# Patient Record
Sex: Female | Born: 1935 | Race: Black or African American | Hispanic: No
Health system: Southern US, Community
[De-identification: ages and names within clinical notes are randomized; demographics above are authoritative.]

## PROBLEM LIST (undated history)

## (undated) DIAGNOSIS — D649 Anemia, unspecified: Secondary | ICD-10-CM

## (undated) DIAGNOSIS — I4891 Unspecified atrial fibrillation: Secondary | ICD-10-CM

## (undated) DIAGNOSIS — I509 Heart failure, unspecified: Secondary | ICD-10-CM

## (undated) DIAGNOSIS — C801 Malignant (primary) neoplasm, unspecified: Secondary | ICD-10-CM

## (undated) DIAGNOSIS — E119 Type 2 diabetes mellitus without complications: Secondary | ICD-10-CM

## (undated) DIAGNOSIS — I10 Essential (primary) hypertension: Secondary | ICD-10-CM

## (undated) DIAGNOSIS — N289 Disorder of kidney and ureter, unspecified: Secondary | ICD-10-CM

## (undated) HISTORY — PX: COLON SURGERY: SHX602

---

## 2020-01-13 ENCOUNTER — Ambulatory Visit: Payer: Medicare Other | Attending: Internal Medicine

## 2020-01-13 DIAGNOSIS — Z23 Encounter for immunization: Secondary | ICD-10-CM | POA: Insufficient documentation

## 2020-01-13 NOTE — Progress Notes (Signed)
   Covid-19 Vaccination Clinic  Name:  Emmaleigh Longo    MRN: [id number] DOB: 1922/05/28  01/13/2020  Ms. Macaulay was observed post Covid-19 immunization for 15 minutes without incidence. She was provided with Vaccine Information Sheet and instruction to access the V-Safe system.   Ms. Primo was instructed to call 911 with any severe reactions post vaccine: Marland Kitchen Difficulty breathing  . Swelling of your face and throat  . A fast heartbeat  . A bad rash all over your body  . Dizziness and weakness    Immunizations Administered    Name Date Dose VIS Date Route   Pfizer COVID-19 Vaccine 01/13/2020  1:48 PM 0.3 mL 11/09/2019 Intramuscular   Manufacturer: [place]   Lot: [id number]   [place]: [id number]

## 2020-01-15 ENCOUNTER — Emergency Department (HOSPITAL_COMMUNITY): Payer: Medicare Other

## 2020-01-15 ENCOUNTER — Other Ambulatory Visit: Payer: Self-pay

## 2020-01-15 ENCOUNTER — Encounter (HOSPITAL_COMMUNITY): Payer: Self-pay | Admitting: Emergency Medicine

## 2020-01-15 ENCOUNTER — Inpatient Hospital Stay (HOSPITAL_COMMUNITY)
Admission: EM | Admit: 2020-01-15 | Discharge: 2020-01-28 | DRG: 189 | Disposition: E | Payer: Medicare Other | Source: Ambulatory Visit | Attending: Internal Medicine | Admitting: Internal Medicine

## 2020-01-15 DIAGNOSIS — J9601 Acute respiratory failure with hypoxia: Secondary | ICD-10-CM | POA: Diagnosis present

## 2020-01-15 DIAGNOSIS — R509 Fever, unspecified: Secondary | ICD-10-CM | POA: Diagnosis present

## 2020-01-15 DIAGNOSIS — R451 Restlessness and agitation: Secondary | ICD-10-CM | POA: Diagnosis not present

## 2020-01-15 DIAGNOSIS — E119 Type 2 diabetes mellitus without complications: Secondary | ICD-10-CM | POA: Diagnosis present

## 2020-01-15 DIAGNOSIS — I4891 Unspecified atrial fibrillation: Secondary | ICD-10-CM | POA: Diagnosis present

## 2020-01-15 DIAGNOSIS — Z7901 Long term (current) use of anticoagulants: Secondary | ICD-10-CM

## 2020-01-15 DIAGNOSIS — E8770 Fluid overload, unspecified: Secondary | ICD-10-CM | POA: Diagnosis present

## 2020-01-15 DIAGNOSIS — N179 Acute kidney failure, unspecified: Secondary | ICD-10-CM | POA: Diagnosis present

## 2020-01-15 DIAGNOSIS — Z880 Allergy status to penicillin: Secondary | ICD-10-CM

## 2020-01-15 DIAGNOSIS — F419 Anxiety disorder, unspecified: Secondary | ICD-10-CM | POA: Diagnosis present

## 2020-01-15 DIAGNOSIS — J90 Pleural effusion, not elsewhere classified: Secondary | ICD-10-CM

## 2020-01-15 DIAGNOSIS — R0603 Acute respiratory distress: Secondary | ICD-10-CM

## 2020-01-15 DIAGNOSIS — C3490 Malignant neoplasm of unspecified part of unspecified bronchus or lung: Secondary | ICD-10-CM | POA: Diagnosis present

## 2020-01-15 DIAGNOSIS — R110 Nausea: Secondary | ICD-10-CM | POA: Diagnosis present

## 2020-01-15 DIAGNOSIS — Z85038 Personal history of other malignant neoplasm of large intestine: Secondary | ICD-10-CM

## 2020-01-15 DIAGNOSIS — K117 Disturbances of salivary secretion: Secondary | ICD-10-CM | POA: Diagnosis present

## 2020-01-15 DIAGNOSIS — Z6839 Body mass index (BMI) 39.0-39.9, adult: Secondary | ICD-10-CM

## 2020-01-15 DIAGNOSIS — I10 Essential (primary) hypertension: Secondary | ICD-10-CM | POA: Diagnosis present

## 2020-01-15 DIAGNOSIS — Z7189 Other specified counseling: Secondary | ICD-10-CM | POA: Diagnosis not present

## 2020-01-15 DIAGNOSIS — C7801 Secondary malignant neoplasm of right lung: Secondary | ICD-10-CM | POA: Diagnosis present

## 2020-01-15 DIAGNOSIS — J9691 Respiratory failure, unspecified with hypoxia: Secondary | ICD-10-CM | POA: Diagnosis present

## 2020-01-15 DIAGNOSIS — Z79899 Other long term (current) drug therapy: Secondary | ICD-10-CM

## 2020-01-15 DIAGNOSIS — J690 Pneumonitis due to inhalation of food and vomit: Secondary | ICD-10-CM | POA: Diagnosis not present

## 2020-01-15 DIAGNOSIS — Z515 Encounter for palliative care: Secondary | ICD-10-CM | POA: Diagnosis not present

## 2020-01-15 DIAGNOSIS — Z8673 Personal history of transient ischemic attack (TIA), and cerebral infarction without residual deficits: Secondary | ICD-10-CM | POA: Diagnosis not present

## 2020-01-15 DIAGNOSIS — R918 Other nonspecific abnormal finding of lung field: Secondary | ICD-10-CM

## 2020-01-15 DIAGNOSIS — K5900 Constipation, unspecified: Secondary | ICD-10-CM | POA: Diagnosis present

## 2020-01-15 DIAGNOSIS — J910 Malignant pleural effusion: Secondary | ICD-10-CM | POA: Diagnosis present

## 2020-01-15 DIAGNOSIS — H04129 Dry eye syndrome of unspecified lacrimal gland: Secondary | ICD-10-CM | POA: Diagnosis present

## 2020-01-15 DIAGNOSIS — Z87442 Personal history of urinary calculi: Secondary | ICD-10-CM

## 2020-01-15 DIAGNOSIS — R339 Retention of urine, unspecified: Secondary | ICD-10-CM | POA: Diagnosis present

## 2020-01-15 DIAGNOSIS — Z882 Allergy status to sulfonamides status: Secondary | ICD-10-CM | POA: Diagnosis not present

## 2020-01-15 DIAGNOSIS — Z7722 Contact with and (suspected) exposure to environmental tobacco smoke (acute) (chronic): Secondary | ICD-10-CM | POA: Diagnosis present

## 2020-01-15 DIAGNOSIS — Z20822 Contact with and (suspected) exposure to covid-19: Secondary | ICD-10-CM | POA: Diagnosis present

## 2020-01-15 DIAGNOSIS — Z8249 Family history of ischemic heart disease and other diseases of the circulatory system: Secondary | ICD-10-CM | POA: Diagnosis not present

## 2020-01-15 DIAGNOSIS — J9692 Respiratory failure, unspecified with hypercapnia: Secondary | ICD-10-CM | POA: Diagnosis present

## 2020-01-15 DIAGNOSIS — J9602 Acute respiratory failure with hypercapnia: Secondary | ICD-10-CM | POA: Diagnosis present

## 2020-01-15 DIAGNOSIS — Z66 Do not resuscitate: Secondary | ICD-10-CM | POA: Diagnosis present

## 2020-01-15 HISTORY — DX: Type 2 diabetes mellitus without complications: E11.9

## 2020-01-15 HISTORY — DX: Essential (primary) hypertension: I10

## 2020-01-15 HISTORY — DX: Malignant (primary) neoplasm, unspecified: C80.1

## 2020-01-15 HISTORY — DX: Disorder of kidney and ureter, unspecified: N28.9

## 2020-01-15 HISTORY — DX: Heart failure, unspecified: I50.9

## 2020-01-15 HISTORY — DX: Unspecified atrial fibrillation: I48.91

## 2020-01-15 HISTORY — DX: Anemia, unspecified: D64.9

## 2020-01-15 LAB — CBC
HCT: 43.3 % (ref 36.0–46.0)
Hemoglobin: 12.9 g/dL (ref 12.0–15.0)
MCH: 24.1 pg — ABNORMAL LOW (ref 26.0–34.0)
MCHC: 29.8 g/dL — ABNORMAL LOW (ref 30.0–36.0)
MCV: 80.8 fL (ref 80.0–100.0)
Platelets: 359 10*3/uL (ref 150–400)
RBC: 5.36 MIL/uL — ABNORMAL HIGH (ref 3.87–5.11)
RDW: 20.3 % — ABNORMAL HIGH (ref 11.5–15.5)
WBC: 10.7 10*3/uL — ABNORMAL HIGH (ref 4.0–10.5)
nRBC: 0 % (ref 0.0–0.2)

## 2020-01-15 LAB — BASIC METABOLIC PANEL
Anion gap: 12 (ref 5–15)
BUN: 19 mg/dL (ref 8–23)
CO2: 24 mmol/L (ref 22–32)
Calcium: 10.3 mg/dL (ref 8.9–10.3)
Chloride: 104 mmol/L (ref 98–111)
Creatinine, Ser: 1.18 mg/dL — ABNORMAL HIGH (ref 0.44–1.00)
GFR calc Af Amer: 45 mL/min — ABNORMAL LOW (ref >60)
GFR calc non Af Amer: 39 mL/min — ABNORMAL LOW (ref >60)
Glucose, Bld: 151 mg/dL — ABNORMAL HIGH (ref 70–99)
Potassium: 4 mmol/L (ref 3.5–5.1)
Sodium: 140 mmol/L (ref 135–145)

## 2020-01-15 LAB — RESPIRATORY PANEL BY RT PCR (FLU A&B, COVID)
Influenza A by PCR: NEGATIVE
Influenza B by PCR: NEGATIVE
SARS Coronavirus 2 by RT PCR: NEGATIVE

## 2020-01-15 LAB — URINALYSIS, ROUTINE W REFLEX MICROSCOPIC
Bilirubin Urine: NEGATIVE
Glucose, UA: NEGATIVE mg/dL
Ketones, ur: NEGATIVE mg/dL
Nitrite: NEGATIVE
Protein, ur: 30 mg/dL — AB
RBC / HPF: >50 RBC/hpf — ABNORMAL HIGH (ref 0–5)
Specific Gravity, Urine: 1.042 — ABNORMAL HIGH (ref 1.005–1.030)
WBC, UA: >50 WBC/hpf — ABNORMAL HIGH (ref 0–5)
pH: 5 (ref 5.0–8.0)

## 2020-01-15 LAB — HEPATIC FUNCTION PANEL
ALT: 15 U/L (ref 0–44)
AST: 14 U/L — ABNORMAL LOW (ref 15–41)
Albumin: 2.8 g/dL — ABNORMAL LOW (ref 3.5–5.0)
Alkaline Phosphatase: 62 U/L (ref 38–126)
Bilirubin, Direct: 0.2 mg/dL (ref 0.0–0.2)
Indirect Bilirubin: 0.7 mg/dL (ref 0.3–0.9)
Total Bilirubin: 0.9 mg/dL (ref 0.3–1.2)
Total Protein: 6.6 g/dL (ref 6.5–8.1)

## 2020-01-15 LAB — BRAIN NATRIURETIC PEPTIDE: B Natriuretic Peptide: 577.6 pg/mL — ABNORMAL HIGH (ref 0.0–100.0)

## 2020-01-15 LAB — TROPONIN I (HIGH SENSITIVITY)
Troponin I (High Sensitivity): 8 ng/L (ref <18)
Troponin I (High Sensitivity): 9 ng/L (ref <18)

## 2020-01-15 LAB — LIPASE, BLOOD: Lipase: 22 U/L (ref 11–51)

## 2020-01-15 LAB — LACTIC ACID, PLASMA: Lactic Acid, Venous: 1.8 mmol/L (ref 0.5–1.9)

## 2020-01-15 MED ORDER — SODIUM CHLORIDE 0.9 % IV BOLUS
500.0000 mL | Freq: Once | INTRAVENOUS | Status: AC
  Administered 2020-01-15: 500 mL via INTRAVENOUS

## 2020-01-15 MED ORDER — FUROSEMIDE 20 MG PO TABS: 20.0000 mg | ORAL_TABLET | Freq: Every day | ORAL | Status: DC

## 2020-01-15 MED ORDER — METOPROLOL TARTRATE 50 MG PO TABS
50.0000 mg | ORAL_TABLET | Freq: Every day | ORAL | Status: DC
  Administered 2020-01-15 – 2020-01-16 (×2): 50 mg via ORAL
  Filled 2020-01-15: qty 2
  Filled 2020-01-15: qty 1

## 2020-01-15 MED ORDER — IOHEXOL 350 MG/ML SOLN
80.0000 mL | Freq: Once | INTRAVENOUS | Status: AC | PRN
  Administered 2020-01-15: 80 mL via INTRAVENOUS

## 2020-01-15 MED ORDER — DILTIAZEM HCL ER COATED BEADS 180 MG PO CP24
180.0000 mg | ORAL_CAPSULE | Freq: Every day | ORAL | Status: DC
  Administered 2020-01-16 – 2020-01-17 (×2): 180 mg via ORAL
  Filled 2020-01-15 (×2): qty 1

## 2020-01-15 MED ORDER — SODIUM CHLORIDE 0.9% FLUSH: 3.0000 mL | Freq: Once | INTRAVENOUS | Status: DC

## 2020-01-15 MED ORDER — METOPROLOL TARTRATE 25 MG PO TABS: 50.0000 mg | ORAL_TABLET | Freq: Two times a day (BID) | ORAL | Status: DC

## 2020-01-15 MED ORDER — DOCUSATE SODIUM 100 MG PO CAPS
100.0000 mg | ORAL_CAPSULE | Freq: Every day | ORAL | Status: DC
  Administered 2020-01-15 – 2020-01-16 (×2): 100 mg via ORAL
  Filled 2020-01-15 (×2): qty 1

## 2020-01-15 MED ORDER — METOPROLOL TARTRATE 100 MG PO TABS
100.0000 mg | ORAL_TABLET | Freq: Every morning | ORAL | Status: DC
  Administered 2020-01-16 – 2020-01-17 (×2): 100 mg via ORAL
  Filled 2020-01-15: qty 4
  Filled 2020-01-15: qty 1

## 2020-01-15 NOTE — ED Notes (Signed)
Pt. Asked for something to eat

## 2020-01-15 NOTE — ED Notes (Signed)
Report given to Will, RN

## 2020-01-15 NOTE — ED Triage Notes (Signed)
Patient presents with daughter stating she was sent from [facility] PCP due to a-fib being out of control, hypoxia and abdominal pain. Daughter states abdominal pain has been going on for months and at night patients sometimes c/o pressure in the left side of chest.   Daughter adds patient has appointment with cardiologist this Friday.

## 2020-01-15 NOTE — H&P (Signed)
History and Physical    Tammy Perez IFO:[id number] DOB: 1922-04-18 DOA: 01/13/2020  PCP: Jonathon Jordan, MD  Patient coming from: home, lives with daughter  I have personally briefly reviewed patient's old medical records in [place]  Chief Complaint: abdominal pain  HPI: Tammy Perez is a 86 y.o. female with medical history significant for colon cancer, TIA, atrial fibrillation on Eliquis, hypertension, and type 2 diabetes who presented with concerns of atrial fibrillation with RVR, hypoxia and abdominal pain from PCP office.  Patient recently moved here in January from [place] to live with her daughter as she was no longer able to care for herself.  Patient reports that since March 2020 she has been having issues with occasional nausea and this became a daily occurrence by July.  Denies ever vomiting.  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  Since then, patient has been getting progressively weaker and has had decreased p.o. intake.  Reportedly she lost about 20 pounds since she got out of rehab. Daughter has also seen that she has been short of breath when she lays flat but patient denies feeling shortness of breath. She presented to her PCP for follow up today and was sent over for evaluation due to hypoxic and tachycardia.  On arrival she was in atrial fibrillation with RVR up to 130 but not sustained. She had desaturation down to 89% regarding 2L via [place].  Leukocytosis of 10.7.  Glucose of 151, creatinine of 1.18.  CTA abdomen/pelvis was obtained due to initial concerns for mesenteric ischemic. It showed significant narrowing at the origin of both the celiac axis and SMA but no concerns of mesenteric ischemia.  No PE Complete occlusion of the right upper lobe pulmonary artery due to mass effect from large right upper lobe lung mass. There  is a multiloculated right sided likely malignant pleural effusion. Indeterminate lesion involving the posterior right iliac crest and attention on follow up recommended.   She denies tobacco use but her husband did smoke but he has been deceased since 1s. Grandson now currently smokes around her. Family hx includes a brother who has cancer but unknown type.  No alcohol or illicit drug use.   She has received 500cc of fluids in the ED.   Review of Systems:  Constitutional: No Weight Change, No Fever ENT/Mouth: No sore throat, No Rhinorrhea Eyes: No Eye Pain, No Vision Changes Cardiovascular: No Chest Pain, no SOB,  No Palpitations Respiratory: No Cough, No Sputum, No Wheezing, no Dyspnea  Gastrointestinal: + Nausea, No Vomiting, No Diarrhea, + Constipation, no Pain Genitourinary:+ Urinary Incontinence Musculoskeletal: No Arthralgias, No Myalgias Skin: No Skin Lesions, No Pruritus, Neuro: no Weakness, No Numbness,  No Loss of Consciousness, No Syncope Psych: No Anxiety/Panic, No Depression, + decrease appetite Heme/Lymph: No Bruising, No Bleeding  Past Medical History:  Diagnosis Date  . Anemia   . Atrial fibrillation ([place])   . Cancer [facility])    Colon  . CHF (congestive heart failure) ([place])   . Diabetes mellitus without complication ([place])   . Hypertension   . Renal disorder    kidney stones    Past Surgical History:  Procedure Laterality Date  . COLON SURGERY        Allergies  Allergen Reactions  . Penicillins Swelling  . Sulfur Itching    Family history Older brother-cancer unknown Parents-hypertension  Prior  to Admission medications   Medication Sig Start Date End Date Taking? Authorizing Provider  acetaminophen (TYLENOL) 650 MG CR tablet Take 1,300 mg by mouth every 8 (eight) hours as needed for pain.   Yes [provider]  DILT-XR 180 MG 24 hr capsule Take 180 mg by mouth daily. 01/03/20  Yes [provider]  Docusate Calcium (STOOL  SOFTENER PO) Take 1 tablet by mouth at bedtime.   Yes [provider]  ELIQUIS 5 MG TABS tablet Take 5 mg by mouth 2 (two) times daily. 01/03/20  Yes [provider]  furosemide (LASIX) 20 MG tablet Take 20 mg by mouth daily.   Yes [provider]  metoprolol tartrate (LOPRESSOR) 50 MG tablet Take 50-100 mg by mouth 2 (two) times daily. 100mg  in the mornings and 50mg  in the evenings   Yes [provider]    Physical Exam: Vitals:   01/14/2020 1242 01/03/2020 1608 01/11/2020 1800 01/12/2020 2000  BP:  (!) 135/96 140/89 (!) 110/91  Pulse:  (!) 42 97 (!) 122  Resp:  (!) 24 (!) 27 (!) 25  Temp:      TempSrc:      SpO2:  99% 99% 97%  Weight: 91.2 kg     Height: 5' (1.524 m)       Constitutional: NAD, calm, comfortable, morbidly obese female laying flat in bed Vitals:   01/07/2020 1242 01/12/2020 1608 01/03/2020 1800 01/09/2020 2000  BP:  (!) 135/96 140/89 (!) 110/91  Pulse:  (!) 42 97 (!) 122  Resp:  (!) 24 (!) 27 (!) 25  Temp:      TempSrc:      SpO2:  99% 99% 97%  Weight: 91.2 kg     Height: 5' (1.524 m)      Eyes: PERRL, lids and conjunctivae normal ENMT: Mucous membranes are moist.  Neck: normal, supple Respiratory: diminished right upper and lower breath sounds, no wheezing, no crackles. Normal respiratory effort on 2L. No accessory muscle use.  Cardiovascular: Irregularly irregular rhythm, no murmurs / rubs / gallops. +3 bilateral pre-tibial lower extremity edema. 2+ pedal pulses.  Abdomen: no tenderness, no masses palpated.  Bowel sounds positive.  GU: diaper and purewick catheter in place Musculoskeletal: no clubbing / cyanosis. No joint deformity upper and lower extremities.no contractures. Normal muscle tone.  Skin: no rashes, lesions, ulcers. No induration Neurologic: CN 2-12 grossly intact. Sensation intact. Strength 4/5 in all 4.  Psychiatric: Normal judgment and insight. Alert and oriented x 3. Normal mood.     Labs on Admission: I have  personally reviewed following labs and imaging studies  CBC: Recent Labs  Lab 01/27/2020 1241  WBC 10.7*  HGB 12.9  HCT 43.3  MCV 80.8  PLT 824   Basic Metabolic Panel: Recent Labs  Lab 01/24/2020 1241  NA 140  K 4.0  CL 104  CO2 24  GLUCOSE 151*  BUN 19  CREATININE 1.18*  CALCIUM 10.3   GFR: Estimated Creatinine Clearance: 28.7 mL/min (A) (by C-G formula based on SCr of 1.18 mg/dL (H)). Liver Function Tests: Recent Labs  Lab 01/16/2020 1656  AST 14*  ALT 15  ALKPHOS 62  BILITOT 0.9  PROT 6.6  ALBUMIN 2.8*   Recent Labs  Lab 01/21/2020 1656  LIPASE 22   No results for input(s): AMMONIA in the last 168 hours. Coagulation Profile: No results for input(s): INR, PROTIME in the last 168 hours. Cardiac Enzymes: No results for input(s): CKTOTAL, CKMB, CKMBINDEX, TROPONINI in  the last 168 hours. BNP (last 3 results) No results for input(s): PROBNP in the last 8760 hours. HbA1C: No results for input(s): HGBA1C in the last 72 hours. CBG: No results for input(s): GLUCAP in the last 168 hours. Lipid Profile: No results for input(s): CHOL, HDL, LDLCALC, TRIG, CHOLHDL, LDLDIRECT in the last 72 hours. Thyroid Function Tests: No results for input(s): TSH, T4TOTAL, FREET4, T3FREE, THYROIDAB in the last 72 hours. Anemia Panel: No results for input(s): VITAMINB12, FOLATE, FERRITIN, TIBC, IRON, RETICCTPCT in the last 72 hours. Urine analysis: No results found for: COLORURINE, APPEARANCEUR, LABSPEC, PHURINE, GLUCOSEU, HGBUR, BILIRUBINUR, KETONESUR, PROTEINUR, UROBILINOGEN, NITRITE, LEUKOCYTESUR  Radiological Exams on Admission: DG Chest 2 View  Result Date: 01/21/2020 CLINICAL DATA:  Chest pain EXAM: CHEST - 2 VIEW COMPARISON:  None. FINDINGS: Partial opacification of the right hemithorax. Bilateral pleural effusions. Left basilar atelectasis/consolidation. No pneumothorax. Normal heart size. Calcified plaque along the aorta. IMPRESSION: Partial opacification right hemithorax  likely reflecting a combination of effusion, atelectasis, and possible superimposed asymmetric pulmonary edema. Left basilar atelectasis/consolidation. Electronically Signed   By: Macy Mis M.D.   On: 01/02/2020 13:31   CT Angio Chest PE W and/or Wo Contrast  Result Date: 01/23/2020 CLINICAL DATA:  Portal vein thrombosis. Concern for mesenteric ischemia. Shortness of breath. EXAM: CT ANGIOGRAPHY CHEST CT ABDOMEN AND PELVIS WITH CONTRAST TECHNIQUE: Multidetector CT imaging of the chest was performed using the standard protocol during bolus administration of intravenous contrast. Multiplanar CT image reconstructions and MIPs were obtained to evaluate the vascular anatomy. Multidetector CT imaging of the abdomen and pelvis was performed using the standard protocol during bolus administration of intravenous contrast. CONTRAST:  28mL OMNIPAQUE IOHEXOL 350 MG/ML SOLN COMPARISON:  None. FINDINGS: CTA CHEST FINDINGS Cardiovascular: Contrast injection is sufficient to demonstrate satisfactory opacification of the pulmonary arteries to the segmental level.There is no acute pulmonary embolism. There is complete occlusion of the right upper lobe pulmonary artery. This is secondary to mass effect from a large right upper lobe lung mass. There is some incomplete filling of several segmental branches of the right lower lobe and right middle lobe which are felt to be artifactual and related to contrast timing as opposed to acute pulmonary emboli. The main pulmonary artery is the pulmonary arteries are dilated. There is no CT evidence of acute right heart strain. There are atherosclerotic changes of the thoracic aorta. There is no evidence for a thoracic aortic aneurysm. Heart size is enlarged. There is no significant pericardial effusion. Coronary artery calcifications are noted. There is reflux of contrast in the IVC consistent with right-sided cardiac dysfunction. Mediastinum/Nodes: --there are few mildly prominent  mediastinal and right hilar lymph nodes. --No axillary lymphadenopathy. --No supraclavicular lymphadenopathy. --Normal thyroid gland. --The esophagus is unremarkable Lungs/Pleura: There is a large right upper lobe lung mass measuring 7.3 x 6.7 cm. This mass abuts the right hilum superiorly and occludes the right upper lobe pulmonary artery. There is a small airspace opacity in the superior left upper lobe measuring approximately 8 mm (axial series 4, image 26). There is a 1 cm pulmonary opacity in the lingula (axial series 4, image 66). There is a complex multiloculated right-sided, likely malignant pleural effusion. There is a 7 mm pulmonary nodule in the superior aspect of the right lower lobe (axial series 4, image 44). There is near complete collapse of the right upper lobe and right middle lobes. There is partial collapse of the right lower lobe. Musculoskeletal: No chest wall abnormality. No acute or significant  osseous findings. Review of the MIP images confirms the above findings. CT ABDOMEN and PELVIS FINDINGS Hepatobiliary: There is no significant abnormality involving the liver. There is a wedge-shaped hyperattenuating area in hepatic segment 5 favored to represent sequela of perfusion differences. Normal gallbladder.There is no biliary ductal dilation. There is no definite evidence for portal vein thrombosis. Appearance of the portal vein at the level of the portal venous confluence is felt to be secondary to mixing artifact. Pancreas: Normal contours without ductal dilatation. No peripancreatic fluid collection. Spleen: No splenic laceration or hematoma. Adrenals/Urinary Tract: --Adrenal glands: No adrenal hemorrhage. --Right kidney/ureter: There is a punctate nonobstructing stone in the interpolar region of the right kidney. There is no right-sided hydronephrosis. --Left kidney/ureter: The lower pole the left kidney is atrophic, likely secondary to multiple obstructing stones measuring up to  approximately 1 cm. This process is almost certainly longstanding. There is a large cyst arising from the upper pole. --Urinary bladder: Unremarkable. Stomach/Bowel: --Stomach/Duodenum: No hiatal hernia or other gastric abnormality. Normal duodenal course and caliber. --Small bowel: No dilatation or inflammation. --Colon: Pelvic floor laxity is noted. There is scattered colonic diverticula without CT evidence for diverticulitis. The patient appears to be status post partial colectomy. --Appendix: Likely surgically absent. Vascular/Lymphatic: Atherosclerotic calcification is present within the non-aneurysmal abdominal aorta, without hemodynamically significant stenosis. There is moderate stenosis at the origin of both the SMA and celiac axis. --No retroperitoneal lymphadenopathy. --No mesenteric lymphadenopathy. --No pelvic or inguinal lymphadenopathy. Reproductive: Unremarkable Other: No ascites or free air. Multiple fat containing ventral wall hernias are noted. Musculoskeletal. There is an indeterminate mix lytic and sclerotic lesion involving the right posterior iliac crest measuring approximately 2.3 by 2.2 cm (axial series 5, image 125). Review of the MIP images confirms the above findings. IMPRESSION: 1. No acute pulmonary embolism. 2. Large right upper lobe lung mass as detailed above highly concerning for bronchogenic carcinoma. This mass causes complete occlusion of portions of the right upper lobe pulmonary artery. 3. Large complex, likely malignant, right-sided pleural effusion. 4. Bilateral pulmonary opacities as detailed above. While these may represent a post infectious or inflammatory etiology, metastatic disease is not excluded. 5. Indeterminate lesion involving the posterior right iliac crest. Attention on follow-up examinations is recommended. Metastatic disease is not excluded. 6. Cardiomegaly with reflux of contrast into the IVC consistent with underlying cardiac dysfunction. The pulmonary  arteries are dilated consistent with underlying pulmonary artery hypertension. 7. Bilateral nephrolithiasis as detailed above. 8. No definite evidence for portal vein thrombosis or mesenteric ischemia. However, there is significant narrowing at the origin of both the celiac axis and SMA. The IMA remains patent. Evaluation of the portal vein was somewhat limited by contrast bolus timing. Aortic Atherosclerosis (ICD10-I70.0). Electronically Signed   By: Constance Holster M.D.   On: 01/25/2020 19:36   CT Angio Abd/Pel W and/or Wo Contrast  Result Date: 01/06/2020 CLINICAL DATA:  Portal vein thrombosis. Concern for mesenteric ischemia. Shortness of breath. EXAM: CT ANGIOGRAPHY CHEST CT ABDOMEN AND PELVIS WITH CONTRAST TECHNIQUE: Multidetector CT imaging of the chest was performed using the standard protocol during bolus administration of intravenous contrast. Multiplanar CT image reconstructions and MIPs were obtained to evaluate the vascular anatomy. Multidetector CT imaging of the abdomen and pelvis was performed using the standard protocol during bolus administration of intravenous contrast. CONTRAST:  71mL OMNIPAQUE IOHEXOL 350 MG/ML SOLN COMPARISON:  None. FINDINGS: CTA CHEST FINDINGS Cardiovascular: Contrast injection is sufficient to demonstrate satisfactory opacification of the pulmonary arteries to the segmental  level.There is no acute pulmonary embolism. There is complete occlusion of the right upper lobe pulmonary artery. This is secondary to mass effect from a large right upper lobe lung mass. There is some incomplete filling of several segmental branches of the right lower lobe and right middle lobe which are felt to be artifactual and related to contrast timing as opposed to acute pulmonary emboli. The main pulmonary artery is the pulmonary arteries are dilated. There is no CT evidence of acute right heart strain. There are atherosclerotic changes of the thoracic aorta. There is no evidence for a  thoracic aortic aneurysm. Heart size is enlarged. There is no significant pericardial effusion. Coronary artery calcifications are noted. There is reflux of contrast in the IVC consistent with right-sided cardiac dysfunction. Mediastinum/Nodes: --there are few mildly prominent mediastinal and right hilar lymph nodes. --No axillary lymphadenopathy. --No supraclavicular lymphadenopathy. --Normal thyroid gland. --The esophagus is unremarkable Lungs/Pleura: There is a large right upper lobe lung mass measuring 7.3 x 6.7 cm. This mass abuts the right hilum superiorly and occludes the right upper lobe pulmonary artery. There is a small airspace opacity in the superior left upper lobe measuring approximately 8 mm (axial series 4, image 26). There is a 1 cm pulmonary opacity in the lingula (axial series 4, image 66). There is a complex multiloculated right-sided, likely malignant pleural effusion. There is a 7 mm pulmonary nodule in the superior aspect of the right lower lobe (axial series 4, image 44). There is near complete collapse of the right upper lobe and right middle lobes. There is partial collapse of the right lower lobe. Musculoskeletal: No chest wall abnormality. No acute or significant osseous findings. Review of the MIP images confirms the above findings. CT ABDOMEN and PELVIS FINDINGS Hepatobiliary: There is no significant abnormality involving the liver. There is a wedge-shaped hyperattenuating area in hepatic segment 5 favored to represent sequela of perfusion differences. Normal gallbladder.There is no biliary ductal dilation. There is no definite evidence for portal vein thrombosis. Appearance of the portal vein at the level of the portal venous confluence is felt to be secondary to mixing artifact. Pancreas: Normal contours without ductal dilatation. No peripancreatic fluid collection. Spleen: No splenic laceration or hematoma. Adrenals/Urinary Tract: --Adrenal glands: No adrenal hemorrhage. --Right  kidney/ureter: There is a punctate nonobstructing stone in the interpolar region of the right kidney. There is no right-sided hydronephrosis. --Left kidney/ureter: The lower pole the left kidney is atrophic, likely secondary to multiple obstructing stones measuring up to approximately 1 cm. This process is almost certainly longstanding. There is a large cyst arising from the upper pole. --Urinary bladder: Unremarkable. Stomach/Bowel: --Stomach/Duodenum: No hiatal hernia or other gastric abnormality. Normal duodenal course and caliber. --Small bowel: No dilatation or inflammation. --Colon: Pelvic floor laxity is noted. There is scattered colonic diverticula without CT evidence for diverticulitis. The patient appears to be status post partial colectomy. --Appendix: Likely surgically absent. Vascular/Lymphatic: Atherosclerotic calcification is present within the non-aneurysmal abdominal aorta, without hemodynamically significant stenosis. There is moderate stenosis at the origin of both the SMA and celiac axis. --No retroperitoneal lymphadenopathy. --No mesenteric lymphadenopathy. --No pelvic or inguinal lymphadenopathy. Reproductive: Unremarkable Other: No ascites or free air. Multiple fat containing ventral wall hernias are noted. Musculoskeletal. There is an indeterminate mix lytic and sclerotic lesion involving the right posterior iliac crest measuring approximately 2.3 by 2.2 cm (axial series 5, image 125). Review of the MIP images confirms the above findings. IMPRESSION: 1. No acute pulmonary embolism. 2. Large right  upper lobe lung mass as detailed above highly concerning for bronchogenic carcinoma. This mass causes complete occlusion of portions of the right upper lobe pulmonary artery. 3. Large complex, likely malignant, right-sided pleural effusion. 4. Bilateral pulmonary opacities as detailed above. While these may represent a post infectious or inflammatory etiology, metastatic disease is not excluded. 5.  Indeterminate lesion involving the posterior right iliac crest. Attention on follow-up examinations is recommended. Metastatic disease is not excluded. 6. Cardiomegaly with reflux of contrast into the IVC consistent with underlying cardiac dysfunction. The pulmonary arteries are dilated consistent with underlying pulmonary artery hypertension. 7. Bilateral nephrolithiasis as detailed above. 8. No definite evidence for portal vein thrombosis or mesenteric ischemia. However, there is significant narrowing at the origin of both the celiac axis and SMA. The IMA remains patent. Evaluation of the portal vein was somewhat limited by contrast bolus timing. Aortic Atherosclerosis (ICD10-I70.0). Electronically Signed   By: Constance Holster M.D.   On: 01/11/2020 19:36    EKG: Independently reviewed.   Assessment/Plan  Acute hypoxic respiratory failure in the setting of large right upper lobe lung mass and multiloculated right sided likely malignant pleural effusion Admitted on 2L Arrange for thoracentesis-  pt had a dose of Eliquis this morning and will hold now. Fluid studies ordered.  Pt does not want treatment if this is a malignancy but still would like biopsy for definitive diagnosis Will need to consult hematology in the morning  Atrial fibrillation with RVR Non-sustained- will go from 110-130 Will give night time metoprolol and monitor Continue diltiazem Hold Eliquis for thoracentesis  Acute kidney injury  Creatinine of 1.18 with unknown baseline Monitor after Lasix held - edematous lower extremities on exam so will hold off on fluids Avoid nephrotoxic agents   Type 2 diabetes BG of 151 on admit. Monitor.  Hypertension Hold Lasix due to AKI  DVT prophylaxis:SCDs Code Status: DNR Family Communication: Plan discussed with patient at bedside and with daughter over the phone disposition Plan: Home with at least 2 midnight stays  Consults called:  Admission status: inpatient with at least  2 midnight stay given acute hypoxic respiratory failure secondary to new lung mass and large pleural effusion.   DVT prophylaxis:SCDs Code Status: Full Family Communication: Plan discussed with patient at bedside and with daughter over the phone  disposition Plan: Home with at least 2 midnight stays  Consults called:  Admission status: inpatient   Tammy Perez T Roper Tolson DO [facility]   If 7PM-7AM, please contact night-coverage [email or web address]   01/23/2020, 10:32 PM

## 2020-01-15 NOTE — ED Notes (Signed)
Admitting MD in room to assess pt for admission.

## 2020-01-15 NOTE — ED Provider Notes (Signed)
[facility] Provider Note   CSN: [id number] Arrival date & time: 01/13/2020  1211     History Chief Complaint  Patient presents with  . Atrial Fibrillation  . Abdominal Pain    Tammy Perez is a 86 y.o. female history of A. fib on Eliquis, previous colon cancer, CHF, diabetes here presenting with hypoxia, abdominal pain.  Patient just moved here from [place] and has no records here.  Patient has been having intermittent abdominal pain for the last 3 months.  Patient also has some palpitations.  Patient also has some left-sided chest pain as well.  Patient went to see her doctor today and was noted to be hypoxic to 88% on room air. Patient denies any fever or cough or chills. Denies any Covid exposure.  Daughter is at bedside.  The history is provided by the patient.       Past Medical History:  Diagnosis Date  . Anemia   . Atrial fibrillation ([place])   . Cancer [facility])    Colon  . CHF (congestive heart failure) ([place])   . Diabetes mellitus without complication ([place])   . Hypertension   . Renal disorder    kidney stones    There are no problems to display for this patient.   Past Surgical History:  Procedure Laterality Date  . COLON SURGERY       OB History   No obstetric history on file.     No family history on file.  Social History   Tobacco Use  . Smoking status: Not on file  Substance Use Topics  . Alcohol use: Not on file  . Drug use: Not on file    Home Medications Prior to Admission medications   Not on File    Allergies    Penicillins and Sulfur  Review of Systems   Review of Systems  Gastrointestinal: Positive for abdominal pain.  All other systems reviewed and are negative.   Physical Exam Updated Vital Signs BP 121/87   Pulse 67   Temp 98.9 F (37.2 C) (Oral)   Resp (!) 24   Ht 5' (1.524 m)   Wt 91.2 kg   SpO2 99%   BMI 39.26 kg/m   Physical Exam Vitals and nursing note reviewed.    Constitutional:      Comments: Chronically ill   HENT:     Head: Normocephalic.     Mouth/Throat:     Mouth: Mucous membranes are moist.  Eyes:     Extraocular Movements: Extraocular movements intact.  Cardiovascular:     Rate and Rhythm: Normal rate and regular rhythm.     Heart sounds: Normal heart sounds.  Pulmonary:     Effort: Pulmonary effort is normal.     Breath sounds: Normal breath sounds.  Abdominal:     General: Abdomen is flat.     Comments: Mild diffuse tenderness, no rebound   Skin:    General: Skin is warm.     Capillary Refill: Capillary refill takes less than 2 seconds.  Neurological:     General: No focal deficit present.     Mental Status: She is alert and oriented to person, place, and time.  Psychiatric:        Mood and Affect: Mood normal.        Behavior: Behavior normal.     ED Results / Procedures / Treatments   Labs (all labs ordered are listed, but only abnormal results are displayed) Labs  Reviewed  BASIC METABOLIC PANEL - Abnormal; Notable for the following components:      Result Value   Glucose, Bld 151 (*)    Creatinine, Ser 1.18 (*)    GFR calc non Af Amer 39 (*)    GFR calc Af Amer 45 (*)    All other components within normal limits  CBC - Abnormal; Notable for the following components:   WBC 10.7 (*)    RBC 5.36 (*)    MCH 24.1 (*)    MCHC 29.8 (*)    RDW 20.3 (*)    All other components within normal limits  RESPIRATORY PANEL BY RT PCR (FLU A&B, COVID)  CULTURE, BLOOD (ROUTINE X 2)  CULTURE, BLOOD (ROUTINE X 2)  LACTIC ACID, PLASMA  LACTIC ACID, PLASMA  HEPATIC FUNCTION PANEL  LIPASE, BLOOD  BRAIN NATRIURETIC PEPTIDE  URINALYSIS, ROUTINE W REFLEX MICROSCOPIC  TROPONIN I (HIGH SENSITIVITY)  TROPONIN I (HIGH SENSITIVITY)    EKG EKG Interpretation  Date/Time:  Tuesday January 15 2020 12:18:38 EST Ventricular Rate:  107 PR Interval:    QRS Duration: 80 QT Interval:  290 QTC Calculation: 387 R Axis:   89 Text  Interpretation: Atrial fibrillation with rapid ventricular response Cannot rule out Anterior infarct , age undetermined Abnormal ECG No previous ECGs available Confirmed by Wandra Arthurs ([id number]) on 01/13/2020 2:55:26 PM   Radiology DG Chest 2 View  Result Date: 01/26/2020 CLINICAL DATA:  Chest pain EXAM: CHEST - 2 VIEW COMPARISON:  None. FINDINGS: Partial opacification of the right hemithorax. Bilateral pleural effusions. Left basilar atelectasis/consolidation. No pneumothorax. Normal heart size. Calcified plaque along the aorta. IMPRESSION: Partial opacification right hemithorax likely reflecting a combination of effusion, atelectasis, and possible superimposed asymmetric pulmonary edema. Left basilar atelectasis/consolidation. Electronically Signed   By: Macy Mis M.D.   On: 01/20/2020 13:31    Procedures Procedures (including critical care time)  CRITICAL CARE Performed by: Wandra Arthurs   Total critical care time: 30 minutes  Critical care time was exclusive of separately billable procedures and treating other patients.  Critical care was necessary to treat or prevent imminent or life-threatening deterioration.  Critical care was time spent personally by me on the following activities: development of treatment plan with patient and/or surrogate as well as nursing, discussions with consultants, evaluation of patient's response to treatment, examination of patient, obtaining history from patient or surrogate, ordering and performing treatments and interventions, ordering and review of laboratory studies, ordering and review of radiographic studies, pulse oximetry and re-evaluation of patient's condition.  Angiocath insertion Performed by: Wandra Arthurs  Consent: Verbal consent obtained. Risks and benefits: risks, benefits and alternatives were discussed Time out: Immediately prior to procedure a "time out" was called to verify the correct patient, procedure, equipment, support staff and  site/side marked as required.  Preparation: Patient was prepped and draped in the usual sterile fashion.  Vein Location: R antecube  Ultrasound Guided  Gauge: 20 long   Normal blood return and flush without difficulty Patient tolerance: Patient tolerated the procedure well with no immediate complications.     Medications Ordered in ED Medications  sodium chloride flush (NS) 0.9 % injection 3 mL (has no administration in time range)  sodium chloride 0.9 % bolus 500 mL (has no administration in time range)    ED Course  I have reviewed the triage vital signs and the nursing notes.  Pertinent labs & imaging results that were available during my care of the patient  were reviewed by me and considered in my medical decision making (see chart for details).    MDM Rules/Calculators/A&P                      Avilyn Virtue is a 86 y.o. female here presenting with abdominal pain and hypoxia. Patient is on Eliquis for A. fib given her presentation, I am concerned for possible mesenteric ischemia.  Hypoxia could be from CHF versus Covid versus pneumonia. Patient has no fever or vomiting. Will get labs, COVID, BNP, CXR. Will need CTA chest and CTA ab/pel and will need admission for hypoxia.   4:43 PM Labs unremarkable. BNP and CTA pending. Anticipate admission after CT results. Signed out to Dr. Vanita Panda.    Final Clinical Impression(s) / ED Diagnoses Final diagnoses:  None    Rx / DC Orders ED Discharge Orders    None       Tammy Freeze, MD 01/07/2020 1644

## 2020-01-15 NOTE — ED Provider Notes (Signed)
Care of the patient assumed at signout.  8:06 PM On exam now, following availability of CT results, interpretation by me, is clear the patient has multiple abnormalities concerning for possible malignancy with likely postobstructive pneumonia.  On given her new oxygen requirement she will require admission for further monitoring, management, consideration of therapeutic options. I discussed this at length with the patient and then with her daughter after obtaining consent.   Carmin Muskrat, MD 01/19/2020 2007

## 2020-01-16 ENCOUNTER — Encounter (HOSPITAL_COMMUNITY): Payer: Self-pay | Admitting: Family Medicine

## 2020-01-16 DIAGNOSIS — Z515 Encounter for palliative care: Secondary | ICD-10-CM

## 2020-01-16 DIAGNOSIS — R0603 Acute respiratory distress: Secondary | ICD-10-CM

## 2020-01-16 DIAGNOSIS — J90 Pleural effusion, not elsewhere classified: Secondary | ICD-10-CM

## 2020-01-16 DIAGNOSIS — E119 Type 2 diabetes mellitus without complications: Secondary | ICD-10-CM

## 2020-01-16 DIAGNOSIS — I10 Essential (primary) hypertension: Secondary | ICD-10-CM

## 2020-01-16 DIAGNOSIS — R918 Other nonspecific abnormal finding of lung field: Secondary | ICD-10-CM

## 2020-01-16 DIAGNOSIS — I4891 Unspecified atrial fibrillation: Secondary | ICD-10-CM

## 2020-01-16 DIAGNOSIS — Z66 Do not resuscitate: Secondary | ICD-10-CM

## 2020-01-16 DIAGNOSIS — N179 Acute kidney failure, unspecified: Secondary | ICD-10-CM

## 2020-01-16 DIAGNOSIS — Z7189 Other specified counseling: Secondary | ICD-10-CM

## 2020-01-16 LAB — CBC
HCT: 40.6 % (ref 36.0–46.0)
Hemoglobin: 11.8 g/dL — ABNORMAL LOW (ref 12.0–15.0)
MCH: 23.8 pg — ABNORMAL LOW (ref 26.0–34.0)
MCHC: 29.1 g/dL — ABNORMAL LOW (ref 30.0–36.0)
MCV: 82 fL (ref 80.0–100.0)
Platelets: 319 10*3/uL (ref 150–400)
RBC: 4.95 MIL/uL (ref 3.87–5.11)
RDW: 19.9 % — ABNORMAL HIGH (ref 11.5–15.5)
WBC: 9.8 10*3/uL (ref 4.0–10.5)
nRBC: 0 % (ref 0.0–0.2)

## 2020-01-16 LAB — BASIC METABOLIC PANEL
Anion gap: 14 (ref 5–15)
BUN: 17 mg/dL (ref 8–23)
CO2: 26 mmol/L (ref 22–32)
Calcium: 10.3 mg/dL (ref 8.9–10.3)
Chloride: 102 mmol/L (ref 98–111)
Creatinine, Ser: 1.03 mg/dL — ABNORMAL HIGH (ref 0.44–1.00)
GFR calc Af Amer: 54 mL/min — ABNORMAL LOW (ref >60)
GFR calc non Af Amer: 46 mL/min — ABNORMAL LOW (ref >60)
Glucose, Bld: 121 mg/dL — ABNORMAL HIGH (ref 70–99)
Potassium: 4.2 mmol/L (ref 3.5–5.1)
Sodium: 142 mmol/L (ref 135–145)

## 2020-01-16 MED ORDER — MORPHINE SULFATE (PF) 2 MG/ML IV SOLN
0.5000 mg | INTRAVENOUS | Status: DC | PRN
  Administered 2020-01-16 – 2020-01-17 (×2): 1 mg via INTRAVENOUS
  Filled 2020-01-16 (×3): qty 1

## 2020-01-16 MED ORDER — APIXABAN 5 MG PO TABS
5.0000 mg | ORAL_TABLET | Freq: Two times a day (BID) | ORAL | Status: DC
  Administered 2020-01-16 – 2020-01-17 (×3): 5 mg via ORAL
  Filled 2020-01-16 (×4): qty 1

## 2020-01-16 NOTE — Plan of Care (Signed)
  Problem: Education: Goal: Knowledge of General Education information will improve Description: Including pain rating scale, medication(s)/side effects and non-pharmacologic comfort measures Outcome: Progressing   Problem: Health Behavior/Discharge Planning: Goal: Ability to manage health-related needs will improve Outcome: Progressing   Problem: Safety: Goal: Ability to remain free from injury will improve Outcome: Progressing   

## 2020-01-16 NOTE — ED Notes (Signed)
Ordered diet tray 

## 2020-01-16 NOTE — Progress Notes (Signed)
PROGRESS NOTE    Tammy Perez  XBD:[id number] DOB: Jun 03, 1922 DOA: 01/21/2020 PCP: Jonathon Jordan, MD  Brief Narrative: Tammy Perez is a 86 y.o. female with medical history significant for colon cancer, TIA, atrial fibrillation on Eliquis, hypertension, and type 2 diabetes who presented with concerns of atrial fibrillation with RVR, hypoxia and abdominal pain from PCP office. Patient recently moved here in January from [place] to live with her daughter as she was no longer able to care for herself.  patient has been getting progressively weaker and has had decreased p.o. intake.   -In the emergency room she was noted to be in rapid A. fib and mild hypoxia requiring 2 L of oxygen  -CT chest noted large right upper lobe lung mass with multiloculated right-sided likely malignant effusion  Assessment & Plan:   Acute hypoxic respiratory failure -Large right upper lobe lung mass presumed malignant -Large multiloculated pleural effusion, likely malignant -Pulmonary consulted for diagnostic and therapeutic thoracentesis, on further discussion with patient's family and patient she did not want any tests or procedures since this would not change management -Palliative consulted for goals of care, as needed morphine added  Atrial fibrillation with RVR -Continue diltiazem, Lopressor -Restart Eliquis, per pulmonary no plans for thoracentesis  Acute kidney injury  Creatinine of 1.18 with unknown baseline -She is fluid overloaded and has known history of congestive heart failure  History of colon cancer  Type 2 diabetes BG of 151 on admit. Monitor.  Hypertension Hold Lasix due to AKI  DVT prophylaxis:SCDs Code Status: DNR Family Communication: No family at bedside will update daughter disposition Plan: To be determined pending discussion with daughter  Consultants:   Pulmonary   Procedures:   Antimicrobials:    Subjective: -" I just do not feel well" unable to  elaborate further  Objective: Vitals:   01/16/20 0600 01/16/20 0615 01/16/20 1015 01/16/20 1130  BP: (!) 103/55 (!) 99/44 (!) 145/90 105/76  Pulse:  (!) 114 (!) 132 (!) 130  Resp: (!) 35 (!) 27 18 (!) 41  Temp:      TempSrc:      SpO2:  97% 95% 96%  Weight:      Height:        Intake/Output Summary (Last 24 hours) at 01/16/2020 1522 Last data filed at 01/16/2020 1311 Gross per 24 hour  Intake --  Output 200 ml  Net -200 ml   Filed Weights   01/07/2020 1242  Weight: 91.2 kg    Examination:  General exam: Elderly female sitting up in bed AAO x2, mild confusion Respiratory system: Poor air movement bilaterally. Cardiovascular system: S1-S2, tachycardic  gastrointestinal system: Abdomen is nondistended, soft and nontender.Normal bowel sounds heard. Central nervous system: Alert and oriented x2. No focal neurological deficits. Extremities: 1-2+ edema Skin: No rashes, lesions or ulcers Psychiatry: Poor insight and judgment     Data Reviewed:   CBC: Recent Labs  Lab 01/14/2020 1241 01/16/20 0404  WBC 10.7* 9.8  HGB 12.9 11.8*  HCT 43.3 40.6  MCV 80.8 82.0  PLT 359 834   Basic Metabolic Panel: Recent Labs  Lab 01/25/2020 1241 01/16/20 0404  NA 140 142  K 4.0 4.2  CL 104 102  CO2 24 26  GLUCOSE 151* 121*  BUN 19 17  CREATININE 1.18* 1.03*  CALCIUM 10.3 10.3   GFR: Estimated Creatinine Clearance: 32.9 mL/min (A) (by C-G formula based on SCr of 1.03 mg/dL (H)). Liver Function Tests: Recent Labs  Lab 01/24/2020 1656  AST  14*  ALT 15  ALKPHOS 62  BILITOT 0.9  PROT 6.6  ALBUMIN 2.8*   Recent Labs  Lab 01/01/2020 1656  LIPASE 22   No results for input(s): AMMONIA in the last 168 hours. Coagulation Profile: No results for input(s): INR, PROTIME in the last 168 hours. Cardiac Enzymes: No results for input(s): CKTOTAL, CKMB, CKMBINDEX, TROPONINI in the last 168 hours. BNP (last 3 results) No results for input(s): PROBNP in the last 8760 hours. HbA1C: No  results for input(s): HGBA1C in the last 72 hours. CBG: No results for input(s): GLUCAP in the last 168 hours. Lipid Profile: No results for input(s): CHOL, HDL, LDLCALC, TRIG, CHOLHDL, LDLDIRECT in the last 72 hours. Thyroid Function Tests: No results for input(s): TSH, T4TOTAL, FREET4, T3FREE, THYROIDAB in the last 72 hours. Anemia Panel: No results for input(s): VITAMINB12, FOLATE, FERRITIN, TIBC, IRON, RETICCTPCT in the last 72 hours. Urine analysis:    Component Value Date/Time   COLORURINE YELLOW 01/04/2020 2303   APPEARANCEUR HAZY (A) 01/25/2020 2303   LABSPEC 1.042 (H) 01/21/2020 2303   PHURINE 5.0 01/23/2020 2303   GLUCOSEU NEGATIVE 01/26/2020 2303   HGBUR MODERATE (A) 01/26/2020 2303   BILIRUBINUR NEGATIVE 01/25/2020 2303   [place] 01/16/2020 2303   PROTEINUR 30 (A) 01/05/2020 2303   NITRITE NEGATIVE 01/07/2020 2303   LEUKOCYTESUR MODERATE (A) 01/13/2020 2303   Sepsis Labs: @LABRCNTIP (procalcitonin:4,lacticidven:4)  ) Recent Results (from the past 240 hour(s))  Blood culture (routine x 2)     Status: None (Preliminary result)   Collection Time: 01/19/2020  4:50 PM   Specimen: BLOOD  Result Value Ref Range Status   Specimen Description BLOOD SITE NOT SPECIFIED  Final   Special Requests   Final    BOTTLES DRAWN AEROBIC AND ANAEROBIC Blood Culture results may not be optimal due to an inadequate volume of blood received in culture bottles   Culture   Final    NO GROWTH < 24 HOURS Performed at [facility], [place] [address], [place], [place] [address]    Report Status PENDING  Incomplete  Respiratory Panel by RT PCR (Flu A&B, Covid) - Nasopharyngeal Swab     Status: None   Collection Time: 01/19/2020  7:07 PM   Specimen: Nasopharyngeal Swab  Result Value Ref Range Status   SARS Coronavirus 2 by RT PCR NEGATIVE NEGATIVE Final    Comment: (NOTE) SARS-CoV-2 target nucleic acids are NOT DETECTED. The SARS-CoV-2 RNA is generally detectable in upper  respiratoy specimens during the acute phase of infection. The lowest concentration of SARS-CoV-2 viral copies this assay can detect is 131 copies/mL. A negative result does not preclude SARS-Cov-2 infection and should not be used as the sole basis for treatment or other patient management decisions. A negative result may occur with  improper specimen collection/handling, submission of specimen other than nasopharyngeal swab, presence of viral mutation(s) within the areas targeted by this assay, and inadequate number of viral copies (<131 copies/mL). A negative result must be combined with clinical observations, patient history, and epidemiological information. The expected result is Negative. Fact Sheet for Patients:  PinkCheek.be Fact Sheet for Healthcare Providers:  GravelBags.it This test is not yet ap proved or cleared by the [place] FDA and  has been authorized for detection and/or diagnosis of SARS-CoV-2 by FDA under an Emergency Use Authorization (EUA). This EUA will remain  in effect (meaning this test can be used) for the duration of the COVID-19 declaration under Section 564(b)(1) of the Act,  21 U.S.C. section 360bbb-3(b)(1), unless the authorization is terminated or revoked sooner.    Influenza A by PCR NEGATIVE NEGATIVE Final   Influenza B by PCR NEGATIVE NEGATIVE Final    Comment: (NOTE) The Xpert Xpress SARS-CoV-2/FLU/RSV assay is intended as an aid in  the diagnosis of influenza from Nasopharyngeal swab specimens and  should not be used as a sole basis for treatment. Nasal washings and  aspirates are unacceptable for Xpert Xpress SARS-CoV-2/FLU/RSV  testing. Fact Sheet for Patients: PinkCheek.be Fact Sheet for Healthcare Providers: GravelBags.it This test is not yet approved or cleared by the [place] FDA and  has been authorized for  detection and/or diagnosis of SARS-CoV-2 by  FDA under an Emergency Use Authorization (EUA). This EUA will remain  in effect (meaning this test can be used) for the duration of the  Covid-19 declaration under Section 564(b)(1) of the Act, 21  U.S.C. section 360bbb-3(b)(1), unless the authorization is  terminated or revoked. Performed at [facility], [place] [address], [place], [place] [address]          Radiology Studies: DG Chest 2 View  Result Date: 01/04/2020 CLINICAL DATA:  Chest pain EXAM: CHEST - 2 VIEW COMPARISON:  None. FINDINGS: Partial opacification of the right hemithorax. Bilateral pleural effusions. Left basilar atelectasis/consolidation. No pneumothorax. Normal heart size. Calcified plaque along the aorta. IMPRESSION: Partial opacification right hemithorax likely reflecting a combination of effusion, atelectasis, and possible superimposed asymmetric pulmonary edema. Left basilar atelectasis/consolidation. Electronically Signed   By: Macy Mis M.D.   On: 01/07/2020 13:31   CT Angio Chest PE W and/or Wo Contrast  Result Date: 01/03/2020 CLINICAL DATA:  Portal vein thrombosis. Concern for mesenteric ischemia. Shortness of breath. EXAM: CT ANGIOGRAPHY CHEST CT ABDOMEN AND PELVIS WITH CONTRAST TECHNIQUE: Multidetector CT imaging of the chest was performed using the standard protocol during bolus administration of intravenous contrast. Multiplanar CT image reconstructions and MIPs were obtained to evaluate the vascular anatomy. Multidetector CT imaging of the abdomen and pelvis was performed using the standard protocol during bolus administration of intravenous contrast. CONTRAST:  86mL OMNIPAQUE IOHEXOL 350 MG/ML SOLN COMPARISON:  None. FINDINGS: CTA CHEST FINDINGS Cardiovascular: Contrast injection is sufficient to demonstrate satisfactory opacification of the pulmonary arteries to the segmental level.There is no acute pulmonary embolism. There is complete occlusion of the  right upper lobe pulmonary artery. This is secondary to mass effect from a large right upper lobe lung mass. There is some incomplete filling of several segmental branches of the right lower lobe and right middle lobe which are felt to be artifactual and related to contrast timing as opposed to acute pulmonary emboli. The main pulmonary artery is the pulmonary arteries are dilated. There is no CT evidence of acute right heart strain. There are atherosclerotic changes of the thoracic aorta. There is no evidence for a thoracic aortic aneurysm. Heart size is enlarged. There is no significant pericardial effusion. Coronary artery calcifications are noted. There is reflux of contrast in the IVC consistent with right-sided cardiac dysfunction. Mediastinum/Nodes: --there are few mildly prominent mediastinal and right hilar lymph nodes. --No axillary lymphadenopathy. --No supraclavicular lymphadenopathy. --Normal thyroid gland. --The esophagus is unremarkable Lungs/Pleura: There is a large right upper lobe lung mass measuring 7.3 x 6.7 cm. This mass abuts the right hilum superiorly and occludes the right upper lobe pulmonary artery. There is a small airspace opacity in the superior left upper lobe measuring approximately 8 mm (axial series 4, image 26). There is a  1 cm pulmonary opacity in the lingula (axial series 4, image 66). There is a complex multiloculated right-sided, likely malignant pleural effusion. There is a 7 mm pulmonary nodule in the superior aspect of the right lower lobe (axial series 4, image 44). There is near complete collapse of the right upper lobe and right middle lobes. There is partial collapse of the right lower lobe. Musculoskeletal: No chest wall abnormality. No acute or significant osseous findings. Review of the MIP images confirms the above findings. CT ABDOMEN and PELVIS FINDINGS Hepatobiliary: There is no significant abnormality involving the liver. There is a wedge-shaped hyperattenuating  area in hepatic segment 5 favored to represent sequela of perfusion differences. Normal gallbladder.There is no biliary ductal dilation. There is no definite evidence for portal vein thrombosis. Appearance of the portal vein at the level of the portal venous confluence is felt to be secondary to mixing artifact. Pancreas: Normal contours without ductal dilatation. No peripancreatic fluid collection. Spleen: No splenic laceration or hematoma. Adrenals/Urinary Tract: --Adrenal glands: No adrenal hemorrhage. --Right kidney/ureter: There is a punctate nonobstructing stone in the interpolar region of the right kidney. There is no right-sided hydronephrosis. --Left kidney/ureter: The lower pole the left kidney is atrophic, likely secondary to multiple obstructing stones measuring up to approximately 1 cm. This process is almost certainly longstanding. There is a large cyst arising from the upper pole. --Urinary bladder: Unremarkable. Stomach/Bowel: --Stomach/Duodenum: No hiatal hernia or other gastric abnormality. Normal duodenal course and caliber. --Small bowel: No dilatation or inflammation. --Colon: Pelvic floor laxity is noted. There is scattered colonic diverticula without CT evidence for diverticulitis. The patient appears to be status post partial colectomy. --Appendix: Likely surgically absent. Vascular/Lymphatic: Atherosclerotic calcification is present within the non-aneurysmal abdominal aorta, without hemodynamically significant stenosis. There is moderate stenosis at the origin of both the SMA and celiac axis. --No retroperitoneal lymphadenopathy. --No mesenteric lymphadenopathy. --No pelvic or inguinal lymphadenopathy. Reproductive: Unremarkable Other: No ascites or free air. Multiple fat containing ventral wall hernias are noted. Musculoskeletal. There is an indeterminate mix lytic and sclerotic lesion involving the right posterior iliac crest measuring approximately 2.3 by 2.2 cm (axial series 5, image  125). Review of the MIP images confirms the above findings. IMPRESSION: 1. No acute pulmonary embolism. 2. Large right upper lobe lung mass as detailed above highly concerning for bronchogenic carcinoma. This mass causes complete occlusion of portions of the right upper lobe pulmonary artery. 3. Large complex, likely malignant, right-sided pleural effusion. 4. Bilateral pulmonary opacities as detailed above. While these may represent a post infectious or inflammatory etiology, metastatic disease is not excluded. 5. Indeterminate lesion involving the posterior right iliac crest. Attention on follow-up examinations is recommended. Metastatic disease is not excluded. 6. Cardiomegaly with reflux of contrast into the IVC consistent with underlying cardiac dysfunction. The pulmonary arteries are dilated consistent with underlying pulmonary artery hypertension. 7. Bilateral nephrolithiasis as detailed above. 8. No definite evidence for portal vein thrombosis or mesenteric ischemia. However, there is significant narrowing at the origin of both the celiac axis and SMA. The IMA remains patent. Evaluation of the portal vein was somewhat limited by contrast bolus timing. Aortic Atherosclerosis (ICD10-I70.0). Electronically Signed   By: Constance Holster M.D.   On: 01/11/2020 19:36   CT Angio Abd/Pel W and/or Wo Contrast  Result Date: 01/10/2020 CLINICAL DATA:  Portal vein thrombosis. Concern for mesenteric ischemia. Shortness of breath. EXAM: CT ANGIOGRAPHY CHEST CT ABDOMEN AND PELVIS WITH CONTRAST TECHNIQUE: Multidetector CT imaging of the chest was  performed using the standard protocol during bolus administration of intravenous contrast. Multiplanar CT image reconstructions and MIPs were obtained to evaluate the vascular anatomy. Multidetector CT imaging of the abdomen and pelvis was performed using the standard protocol during bolus administration of intravenous contrast. CONTRAST:  52mL OMNIPAQUE IOHEXOL 350 MG/ML SOLN  COMPARISON:  None. FINDINGS: CTA CHEST FINDINGS Cardiovascular: Contrast injection is sufficient to demonstrate satisfactory opacification of the pulmonary arteries to the segmental level.There is no acute pulmonary embolism. There is complete occlusion of the right upper lobe pulmonary artery. This is secondary to mass effect from a large right upper lobe lung mass. There is some incomplete filling of several segmental branches of the right lower lobe and right middle lobe which are felt to be artifactual and related to contrast timing as opposed to acute pulmonary emboli. The main pulmonary artery is the pulmonary arteries are dilated. There is no CT evidence of acute right heart strain. There are atherosclerotic changes of the thoracic aorta. There is no evidence for a thoracic aortic aneurysm. Heart size is enlarged. There is no significant pericardial effusion. Coronary artery calcifications are noted. There is reflux of contrast in the IVC consistent with right-sided cardiac dysfunction. Mediastinum/Nodes: --there are few mildly prominent mediastinal and right hilar lymph nodes. --No axillary lymphadenopathy. --No supraclavicular lymphadenopathy. --Normal thyroid gland. --The esophagus is unremarkable Lungs/Pleura: There is a large right upper lobe lung mass measuring 7.3 x 6.7 cm. This mass abuts the right hilum superiorly and occludes the right upper lobe pulmonary artery. There is a small airspace opacity in the superior left upper lobe measuring approximately 8 mm (axial series 4, image 26). There is a 1 cm pulmonary opacity in the lingula (axial series 4, image 66). There is a complex multiloculated right-sided, likely malignant pleural effusion. There is a 7 mm pulmonary nodule in the superior aspect of the right lower lobe (axial series 4, image 44). There is near complete collapse of the right upper lobe and right middle lobes. There is partial collapse of the right lower lobe. Musculoskeletal: No  chest wall abnormality. No acute or significant osseous findings. Review of the MIP images confirms the above findings. CT ABDOMEN and PELVIS FINDINGS Hepatobiliary: There is no significant abnormality involving the liver. There is a wedge-shaped hyperattenuating area in hepatic segment 5 favored to represent sequela of perfusion differences. Normal gallbladder.There is no biliary ductal dilation. There is no definite evidence for portal vein thrombosis. Appearance of the portal vein at the level of the portal venous confluence is felt to be secondary to mixing artifact. Pancreas: Normal contours without ductal dilatation. No peripancreatic fluid collection. Spleen: No splenic laceration or hematoma. Adrenals/Urinary Tract: --Adrenal glands: No adrenal hemorrhage. --Right kidney/ureter: There is a punctate nonobstructing stone in the interpolar region of the right kidney. There is no right-sided hydronephrosis. --Left kidney/ureter: The lower pole the left kidney is atrophic, likely secondary to multiple obstructing stones measuring up to approximately 1 cm. This process is almost certainly longstanding. There is a large cyst arising from the upper pole. --Urinary bladder: Unremarkable. Stomach/Bowel: --Stomach/Duodenum: No hiatal hernia or other gastric abnormality. Normal duodenal course and caliber. --Small bowel: No dilatation or inflammation. --Colon: Pelvic floor laxity is noted. There is scattered colonic diverticula without CT evidence for diverticulitis. The patient appears to be status post partial colectomy. --Appendix: Likely surgically absent. Vascular/Lymphatic: Atherosclerotic calcification is present within the non-aneurysmal abdominal aorta, without hemodynamically significant stenosis. There is moderate stenosis at the origin of both the SMA  and celiac axis. --No retroperitoneal lymphadenopathy. --No mesenteric lymphadenopathy. --No pelvic or inguinal lymphadenopathy. Reproductive: Unremarkable  Other: No ascites or free air. Multiple fat containing ventral wall hernias are noted. Musculoskeletal. There is an indeterminate mix lytic and sclerotic lesion involving the right posterior iliac crest measuring approximately 2.3 by 2.2 cm (axial series 5, image 125). Review of the MIP images confirms the above findings. IMPRESSION: 1. No acute pulmonary embolism. 2. Large right upper lobe lung mass as detailed above highly concerning for bronchogenic carcinoma. This mass causes complete occlusion of portions of the right upper lobe pulmonary artery. 3. Large complex, likely malignant, right-sided pleural effusion. 4. Bilateral pulmonary opacities as detailed above. While these may represent a post infectious or inflammatory etiology, metastatic disease is not excluded. 5. Indeterminate lesion involving the posterior right iliac crest. Attention on follow-up examinations is recommended. Metastatic disease is not excluded. 6. Cardiomegaly with reflux of contrast into the IVC consistent with underlying cardiac dysfunction. The pulmonary arteries are dilated consistent with underlying pulmonary artery hypertension. 7. Bilateral nephrolithiasis as detailed above. 8. No definite evidence for portal vein thrombosis or mesenteric ischemia. However, there is significant narrowing at the origin of both the celiac axis and SMA. The IMA remains patent. Evaluation of the portal vein was somewhat limited by contrast bolus timing. Aortic Atherosclerosis (ICD10-I70.0). Electronically Signed   By: Constance Holster M.D.   On: 01/01/2020 19:36        Scheduled Meds: . diltiazem  180 mg Oral Daily  . docusate sodium  100 mg Oral QHS  . metoprolol tartrate  100 mg Oral q morning - 10a   And  . metoprolol tartrate  50 mg Oral QHS  . sodium chloride flush  3 mL Intravenous Once   Continuous Infusions:   LOS: 1 day    Time spent: 63min  Domenic Polite, MD [facility]  01/16/2020, 3:22 PM

## 2020-01-16 NOTE — Progress Notes (Signed)
Transferred from ED, oriented to unit & call bell system. A&Ox4, but forgetful with scattered speech. VSS, SpO2 >92% on 2L [place]. No c/o pain throughout shift.   All safety measures in place, will report to oncoming shift.  Tomie China

## 2020-01-16 NOTE — Consult Note (Signed)
Consultation Note Date: 01/16/2020   Patient Name: Tammy Perez  DOB: 10-31-1922  MRN: [id number]  Age / Sex: 86 y.o., female  PCP: Tammy Jordan, MD Referring Physician: Domenic Polite, MD  Reason for Consultation: Establishing goals of care and Hospice Evaluation  HPI/Patient Profile:  H&P --> Tammy Perez is a 86 y.o. female with medical history significant for colon cancer, TIA, atrial fibrillation on Eliquis, hypertension, and type 2 diabetes who presented with concerns of atrial fibrillation with RVR, hypoxia and abdominal pain from PCP office.  Patient recently moved here in January from [place] to live with her daughter as she was no longer able to care for herself.  Patient reports that since March 2020 she has been having issues with occasional nausea and this became a daily occurrence by July.  Denies ever vomiting.  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  Since then, patient has been getting progressively weaker and has had decreased p.o. intake.  Reportedly she lost about 20 pounds since she got out of rehab. Daughter has also seen that she has been short of breath when she lays flat but patient denies feeling shortness of breath. She presented to her PCP for follow up today and was sent over for evaluation due to hypoxic and tachycardia.  Clinical Assessment and Goals of Care: I have reviewed medical records including EPIC notes, labs and imaging, received report from bedside RN, assessed the patient. Tammy Perez is bright eyed upon assessment. Endorses feeling better from a breathing perspective with supplemental oxygen.    I met with Tammy Perez and called Tammy Perez (daughter) to further discuss diagnosis prognosis, GOC, EOL wishes, disposition and  options.   I introduced Palliative Medicine as specialized medical care for people living with serious illness. It focuses on providing relief from the symptoms and stress of a serious illness. The goal is to improve quality of life for both the patient and the family.  I asked Tammy Perez to tell me a little bit about herself. She was able to tell me that she is originally from [place]. She was happily married for fifty eight years. Her husband passed away in 01-23-98 on hospice. Tammy Perez had six children, four daughters Tammy Perez, Tammy Perez, and Tammy Perez. She had two sons both of whom are deceased, one very recently (three weeks ago). Allowed her time to verbally express her grief related to this loss. Offered therapeutic listening as a form of emotional support.   Tammy Perez was living in [place] up until a few weeks ago when she relocated to [place] to live with her daughter, Tammy Perez. She vocalized no longer being able to take care of herself. She is of [place] faith. 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 She verbalized "loving" fishing and gardening.   Regarding what [name] is able to do  for herself, she verbalizes that she need helps with all bADLs with the exception of feeding herself. She is able to mobilize with her rollator walker.  Tammy Perez shares that she was devastated yesterday knowing that her mother came here to have a second chance at life with her and now has a terminal diagnosis. We discussed faith and retaining hope. We talked about how sometimes hope translates into allowing people to have the best final phases of their life as we can enable. [name] feels that "God is in control" and she is "at peace" with her potential departure from this life which does bring [place] some peace of mind.   We talked about hospice and what that looks like in the home setting. Discussed how hospice is identified for people are  estimated to have less than six months to live due to a terminal diagnosis. Tammy Perez states that she understands and would not want her mother to endure any undue suffering. Tammy Perez confirms that [name] is a DNR/DNI code status.   She requested that we talk to her sisters via a conference call to enable them all to hear the same information at the same time.  We have set up a conference call for tomorrow at 1300.  A hospice consultation was placed which Tammy Perez was in agreement with as the symptom focused ideals best align with her mothers wishes for future care. [name], and myself were all able to willfully participate in these conversations and planning.   Discussed with patient the importance of continued conversation with family and their  medical providers regarding overall plan of care and treatment options, ensuring decisions are within the context of the patients values and GOCs.  Decision Maker: Tammy Perez (Daughter) [phone]   SUMMARY OF RECOMMENDATIONS   DNR/DNI, no heroic measures  Family Meeting Tomorrow at [place]  Code Status/Advance Care Planning:  DNR  Symptom Management:  Dyspnea:  - Morphine 0.5-22m IV Q2H PRN  - Supplemental oxygen PRN   Palliative Prophylaxis:   Aspiration, Bowel Regimen, Delirium Protocol, Eye Care, Frequent Pain Assessment, Oral Care, Palliative Wound Care and Turn Reposition  Additional Recommendations (Limitations, Scope, Preferences):  Minimize symptom burden   Psycho-social/Spiritual:   Desire for further Chaplaincy support: Yes  Additional Recommendations: Caregiving  Support/Resources and Education on Hospice  Prognosis:   < 6 Months due to right upper lobe lung mass presumed to be cancerous  Discharge Planning: Home with Hospice     Primary Diagnoses: Present on Admission: . Acute respiratory failure with hypoxia ([facility]  I have reviewed the medical record,  interviewed the patient and family, and examined the patient. The following aspects are pertinent.  Past Medical History:  Diagnosis Date  . Anemia   . Atrial fibrillation ([facility]   . Cancer ([facility]    Colon  . [place]F ([place]ive heart failure) ([facility]   . Diabetes mellitus without complication ([facility]   . Hypertension   . Renal disorder    kidney stones   Social History   Socioeconomic History  . Marital status: Widowed    Spouse name: Not on file  . Number of children: Not on file  . Years of education: Not on file  . Highest education level: Not on file  Occupational History  . Not on file  Tobacco Use  . Smoking status: Unknown If Ever Smoked  Substance and Sexual Activity  . Alcohol use: Not on file  . Drug use: Not on  file  . Sexual activity: Not on file  Other Topics Concern  . Not on file  Social History Narrative  . Not on file   Social Determinants of Health   Financial Resource Strain:   . Difficulty of Paying Living Expenses: Not on file  Food Insecurity:   . Worried About Charity fundraiser in the Last Year: Not on file  . Ran Out of Food in the Last Year: Not on file  Transportation Needs:   . Lack of Transportation (Medical): Not on file  . Lack of Transportation (Non-Medical): Not on file  Physical Activity:   . Days of Exercise per Week: Not on file  . Minutes of Exercise per Session: Not on file  Stress:   . Feeling of Stress : Not on file  Social Connections:   . Frequency of Communication with Friends and Family: Not on file  . Frequency of Social Gatherings with Friends and Family: Not on file  . Attends Religious Services: Not on file  . Active Member of Clubs or Organizations: Not on file  . Attends Archivist Meetings: Not on file  . Marital Status: Not on file   No family history on file. Scheduled Meds: . apixaban  5 mg Oral BID  . diltiazem  180 mg Oral Daily  . docusate sodium  100 mg Oral QHS  . metoprolol tartrate  100 mg  Oral q morning - 10a   And  . metoprolol tartrate  50 mg Oral QHS  . sodium chloride flush  3 mL Intravenous Once   Continuous Infusions: PRN Meds:.morphine injection Medications Prior to Admission:  Prior to Admission medications   Medication Sig Start Date End Date Taking? Authorizing Provider  acetaminophen (TYLENOL) 650 MG CR tablet Take 1,300 mg by mouth every 8 (eight) hours as needed for pain.   Yes [provider]  DILT-XR 180 MG 24 hr capsule Take 180 mg by mouth daily. 01/03/20  Yes [provider]  Docusate Calcium (STOOL SOFTENER PO) Take 1 tablet by mouth at bedtime.   Yes [provider]  ELIQUIS 5 MG TABS tablet Take 5 mg by mouth 2 (two) times daily. 01/03/20  Yes [provider]  furosemide (LASIX) 20 MG tablet Take 20 mg by mouth daily.   Yes [provider]  metoprolol tartrate (LOPRESSOR) 50 MG tablet Take 50-100 mg by mouth 2 (two) times daily. 172m in the mornings and 552min the evenings   Yes [provider]   Allergies  Allergen Reactions  . Penicillins Swelling  . Sulfur Itching   Review of Systems  Constitutional: Positive for activity change and appetite change.  Respiratory: Positive for shortness of breath.   Gastrointestinal: Positive for abdominal distention and nausea.   Physical Exam Vitals and nursing note reviewed.  HENT:     Head: Normocephalic.     Mouth/Throat:     Mouth: Mucous membranes are moist.     Pharynx: Oropharynx is clear.  Eyes:     Extraocular Movements: Extraocular movements intact.  Cardiovascular:     Rate and Rhythm: Tachycardia present. Rhythm irregular.  Abdominal:     General: Abdomen is protuberant. Bowel sounds are normal.     Palpations: Abdomen is soft.  Skin:    General: Skin is warm and dry.     Capillary Refill: Capillary refill takes less than 2 seconds.  Neurological:     General: No focal deficit present.  Mental Status: She is alert.  Psychiatric:         Mood and Affect: Mood normal.    Vital Signs: BP 116/79 (BP Location: Left Arm)   Pulse (!) 110   Temp 98.9 F (37.2 C) (Oral)   Resp 18   Ht 5' (1.524 m)   Wt 91.2 kg   SpO2 98%   BMI 39.26 kg/m  Pain Scale: 0-10   Pain Score: Asleep  SpO2: SpO2: 98 % O2 Device:SpO2: 98 % O2 Flow Rate: .O2 Flow Rate (L/min): 2 L/min  IO: Intake/output summary:   Intake/Output Summary (Last 24 hours) at 01/16/2020 1745 Last data filed at 01/16/2020 1311 Gross per 24 hour  Intake --  Output 200 ml  Net -200 ml   LBM:   Baseline Weight: Weight: 91.2 kg Most recent weight: Weight: 91.2 kg     Palliative Assessment/Data: 40%   Time In: 1715 Time Out: 1825 Time Total: 70 Greater than 50%  of this time was spent counseling and coordinating care related to the above assessment and plan.  Signed by: Rosezella Rumpf, NP   Please contact Palliative Medicine Team phone at [phone] for questions and concerns.  For individual provider: See Shea Evans

## 2020-01-16 NOTE — Consult Note (Signed)
NAME:  Sydne Krahl, MRN:  [id number], DOB:  1922-04-23, LOS: 1 ADMISSION DATE:  01/14/2020, CONSULTATION DATE:  01/16/20 REFERRING MD:  Broadus John  CHIEF COMPLAINT:  Lung Mass   Brief History   Laiylah Roettger is a 86 y.o. female who was admitted 2/17 with abd pain.  Incidentally found to have large RUL lung mass and right effusion for which PCCM was consulted.  History of present illness   Tasheka Houseman is a 86 y.o. female who has a PMH including but not limited to colon CA, A.fib on eliquis (see "past medical history" for rest).  She recently moved to [place] from [place] in January.  She had admission there in September 2020 and had a vulvar cyst removed.  At the time, she was also told that she had a "blockage" of her aorta.  Apparently since thenh, shes had problems with progressive weakness and decreased PO intake.  She saw PCP 2/16 and while there, had abd pain, hypoxia and tachycardia.  She was subsequently sent to the ED for further evaluation.  In ED, she was found in [place] with HR 130's.  Sats were 89% on room air so she was placed on 2L O2.  She had CTA abd / pelvis / chest that demonstrated significant narrowing at origin of the celiac axis and SMA without signs of mesenteric ischemia.  She was also found to have complete occlusion of the RUL PA due to large mass along with probable malignant effusion.  She was admitted by [facility] and PCCM was asked to evaluate for lung mass.  No personal hx of tobacco use but did have significant 2nd hand smoke exposure from husband who smoked.  Grandson now occasionally still smokes around her.  Pt has a brother who had CA but she is unsure of the type.  Past Medical History  has Acute respiratory failure with hypoxia ([place]); Lung mass; Pleural effusion; Atrial fibrillation with RVR ([place]); AKI (acute kidney injury) ([place]); Type 2 diabetes mellitus without complication ([place]); and Essential hypertension on their problem list.  Significant Hospital Events    2/17 > admit.  Consults:  Palliative Care  Procedures:  None.  Significant Diagnostic Tests:  CTA chest / abd / pelv 2/17 > no PE.  Large RUL mass, right effusion.  Significant narrowing of origin of celiac axis and SMA.  Micro Data:  Flu 2/16 > neg. COVID 2/16 > neg.  Antimicrobials:  None.   Interim history/subjective:  Has ongoing abd pain.  Not very dyspneic unless has a episode of abrupt worsening in pain.  Objective:  Blood pressure 105/76, pulse (!) 130, temperature 98.9 F (37.2 C), temperature source Oral, resp. rate (!) 41, height 5' (1.524 m), weight 91.2 kg, SpO2 96 %.        Intake/Output Summary (Last 24 hours) at 01/16/2020 1405 Last data filed at 01/16/2020 1311 Gross per 24 hour  Intake --  Output 200 ml  Net -200 ml   Filed Weights   01/24/2020 1242  Weight: 91.2 kg    Examination: General: Elderly female, in NAD. Neuro: A&O x 3, no deficits. HEENT: [place]/AT. Sclerae anicteric.  EOMI. Cardiovascular: IRIR, no M/R/G.  Lungs: Respirations shallow but unlabored.  Diminished on right, otherwise clear. Abdomen: BS x 4, soft.  Tender to deep palpation. Musculoskeletal: No gross deformities, 2+ edema.  Skin: Intact, warm, no rashes.  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 - Pt and daughters agree that they  would NOT want to pursue any type of biopsy as they would not want to pursue treatment. - Pt and daughter agree to not put pt through unnecessary procedures that will offer little benefit (ie thoracentesis). - Palliative care consult placed, pt and daughter agreeable to start these conversations.  Acute hypoxic respiratory failure - 2/2 above. - Continue supplemental O2 as needed to maintain SpO2 > 92%. - PRN morphine for dyspnea. - Palliative care consult as above.  Rest per primary team.   Nothing further to add.  PCCM will sign off.  Please do not hesitate to call us back if we can be of any  further assistance.  Best Practice:  Diet: Regular. Pain/Anxiety/Delirium protocol (if indicated): N/A. VAP protocol (if indicated): N/A. DVT prophylaxis: SCD's / eliquis. GI prophylaxis: N/A. Glucose control: N/A. Mobility: Up with assist only. Code Status: DNR. Family Communication: Pt and daughter updated at bedside.  Second daughter updated over the phone. Disposition: Tele.  Labs   CBC: Recent Labs  Lab 01/27/2020 1241 01/16/20 0404  WBC 10.7* 9.8  HGB 12.9 11.8*  HCT 43.3 40.6  MCV 80.8 82.0  PLT 359 191   Basic Metabolic Panel: Recent Labs  Lab 01/01/2020 1241 01/16/20 0404  NA 140 142  K 4.0 4.2  CL 104 102  CO2 24 26  GLUCOSE 151* 121*  BUN 19 17  CREATININE 1.18* 1.03*  CALCIUM 10.3 10.3   GFR: Estimated Creatinine Clearance: 32.9 mL/min (A) (by C-G formula based on SCr of 1.03 mg/dL (H)). Recent Labs  Lab 01/03/2020 1241 01/20/2020 1656 01/16/20 0404  WBC 10.7*  --  9.8  LATICACIDVEN  --  1.8  --    Liver Function Tests: Recent Labs  Lab 01/24/2020 1656  AST 14*  ALT 15  ALKPHOS 62  BILITOT 0.9  PROT 6.6  ALBUMIN 2.8*   Recent Labs  Lab 01/07/2020 1656  LIPASE 22   No results for input(s): AMMONIA in the last 168 hours. ABG No results found for: PHART, PCO2ART, PO2ART, HCO3, TCO2, ACIDBASEDEF, O2SAT  Coagulation Profile: No results for input(s): INR, PROTIME in the last 168 hours. Cardiac Enzymes: No results for input(s): CKTOTAL, CKMB, CKMBINDEX, TROPONINI in the last 168 hours. HbA1C: No results found for: HGBA1C CBG: No results for input(s): GLUCAP in the last 168 hours.  Review of Systems:   All negative; except for those that are bolded, which indicate positives.  Constitutional: weight loss, weight gain, night sweats, fevers, chills, fatigue, weakness.  HEENT: headaches, sore throat, sneezing, nasal congestion, post nasal drip, difficulty swallowing, tooth/dental problems, visual complaints, visual changes, ear aches. Neuro:  difficulty with speech, weakness, numbness, ataxia. CV:  chest pain, orthopnea, PND, swelling in lower extremities, dizziness, palpitations, syncope.  Resp: cough, hemoptysis, dyspnea, wheezing. GI: heartburn, indigestion, abdominal pain, nausea, vomiting, diarrhea, constipation, change in bowel habits, loss of appetite, hematemesis, melena, hematochezia.  GU: dysuria, change in color of urine, urgency or frequency, flank pain, hematuria. MSK: joint pain or swelling, decreased range of motion. Psych: change in mood or affect, depression, anxiety, suicidal ideations, homicidal ideations. Skin: rash, itching, bruising.   Past medical history  She,  has a past medical history of Anemia, Atrial fibrillation ([place]), Cancer ([place]), CHF (congestive heart failure) ([place]), Diabetes mellitus without complication ([place]), Hypertension, and Renal disorder.   Surgical History    Past Surgical History:  Procedure Laterality Date  . COLON SURGERY       Social History      Family history  Her family history is not on file.   Allergies Allergies  Allergen Reactions  . Penicillins Swelling  . Sulfur Itching     Home meds  Prior to Admission medications   Medication Sig Start Date End Date Taking? Authorizing Provider  acetaminophen (TYLENOL) 650 MG CR tablet Take 1,300 mg by mouth every 8 (eight) hours as needed for pain.   Yes [provider]  DILT-XR 180 MG 24 hr capsule Take 180 mg by mouth daily. 01/03/20  Yes [provider]  Docusate Calcium (STOOL SOFTENER PO) Take 1 tablet by mouth at bedtime.   Yes [provider]  ELIQUIS 5 MG TABS tablet Take 5 mg by mouth 2 (two) times daily. 01/03/20  Yes [provider]  furosemide (LASIX) 20 MG tablet Take 20 mg by mouth daily.   Yes [provider]  metoprolol tartrate (LOPRESSOR) 50 MG tablet Take 50-100 mg by mouth 2 (two) times daily. 100mg  in the mornings and 50mg  in the evenings   Yes [provider]    Montey Hora, [facility] 01/16/2020, 2:05 PM

## 2020-01-17 DIAGNOSIS — C7801 Secondary malignant neoplasm of right lung: Secondary | ICD-10-CM

## 2020-01-17 DIAGNOSIS — Z515 Encounter for palliative care: Secondary | ICD-10-CM

## 2020-01-17 MED ORDER — HALOPERIDOL 0.5 MG PO TABS
0.5000 mg | ORAL_TABLET | ORAL | Status: DC | PRN
  Filled 2020-01-17: qty 1

## 2020-01-17 MED ORDER — LORAZEPAM 2 MG/ML IJ SOLN: 0.5000 mg | INTRAMUSCULAR | Status: DC | PRN

## 2020-01-17 MED ORDER — GLYCOPYRROLATE 0.2 MG/ML IJ SOLN
0.2000 mg | INTRAMUSCULAR | Status: DC | PRN
  Filled 2020-01-17: qty 1

## 2020-01-17 MED ORDER — HALOPERIDOL LACTATE 5 MG/ML IJ SOLN: 0.5000 mg | INTRAMUSCULAR | Status: DC | PRN

## 2020-01-17 MED ORDER — HALOPERIDOL LACTATE 2 MG/ML PO CONC
0.5000 mg | ORAL | Status: DC | PRN
  Filled 2020-01-17: qty 0.3

## 2020-01-17 MED ORDER — ONDANSETRON HCL 4 MG/2ML IJ SOLN: 4.0000 mg | Freq: Four times a day (QID) | INTRAMUSCULAR | Status: DC | PRN

## 2020-01-17 MED ORDER — MORPHINE 100MG IN NS 100ML (1MG/ML) PREMIX INFUSION
2.0000 mg/h | INTRAVENOUS | Status: DC
  Administered 2020-01-17: 1 mg/h via INTRAVENOUS

## 2020-01-17 MED ORDER — ONDANSETRON 4 MG PO TBDP: 4.0000 mg | ORAL_TABLET | Freq: Four times a day (QID) | ORAL | Status: DC | PRN

## 2020-01-17 MED ORDER — MORPHINE SULFATE (PF) 2 MG/ML IV SOLN
1.0000 mg | Freq: Once | INTRAVENOUS | Status: AC
  Administered 2020-01-17: 1 mg via INTRAVENOUS

## 2020-01-17 MED ORDER — GLYCOPYRROLATE 0.2 MG/ML IJ SOLN
0.2000 mg | Freq: Two times a day (BID) | INTRAMUSCULAR | Status: DC
  Administered 2020-01-17 (×2): 0.2 mg via INTRAVENOUS
  Filled 2020-01-17 (×4): qty 1

## 2020-01-17 MED ORDER — LORAZEPAM 2 MG/ML IJ SOLN
0.2500 mg | Freq: Two times a day (BID) | INTRAMUSCULAR | Status: DC
  Administered 2020-01-17 – 2020-01-18 (×3): 0.25 mg via INTRAVENOUS
  Filled 2020-01-17 (×3): qty 1

## 2020-01-17 MED ORDER — GLYCOPYRROLATE 1 MG PO TABS
1.0000 mg | ORAL_TABLET | ORAL | Status: DC | PRN
  Filled 2020-01-17: qty 1

## 2020-01-17 MED ORDER — BIOTENE DRY MOUTH MT LIQD: 15.0000 mL | OROMUCOSAL | Status: DC | PRN

## 2020-01-17 MED ORDER — MORPHINE BOLUS VIA INFUSION
2.0000 mg | INTRAVENOUS | Status: DC | PRN
  Filled 2020-01-17: qty 2

## 2020-01-17 MED ORDER — MORPHINE SULFATE (PF) 2 MG/ML IV SOLN: 1.0000 mg | INTRAVENOUS | Status: DC | PRN

## 2020-01-17 MED ORDER — PROMETHAZINE HCL 25 MG/ML IJ SOLN: 12.5000 mg | INTRAMUSCULAR | Status: DC | PRN

## 2020-01-17 MED ORDER — MORPHINE SULFATE (CONCENTRATE) 10 MG/0.5ML PO SOLN: 5.0000 mg | ORAL | Status: DC | PRN

## 2020-01-17 MED ORDER — POLYVINYL ALCOHOL 1.4 % OP SOLN
1.0000 [drp] | Freq: Four times a day (QID) | OPHTHALMIC | Status: DC | PRN
  Filled 2020-01-17: qty 15

## 2020-01-17 NOTE — Progress Notes (Addendum)
PROGRESS NOTE    Tammy Perez  OMV:[id number] DOB: 1922-05-31 DOA: 01/04/2020 PCP: Jonathon Jordan, MD  Brief Narrative: Tammy Perez is a 86 y.o. female with medical history significant for colon cancer, TIA, atrial fibrillation on Eliquis, hypertension, and type 2 diabetes who presented with concerns of atrial fibrillation with RVR, hypoxia and abdominal pain from PCP office. Patient recently moved here in January from [place] to live with her daughter as she was no longer able to care for herself.  patient has been getting progressively weaker and has had decreased p.o. intake.   -In the emergency room she was noted to be in rapid A. fib and mild hypoxia requiring 2 L of oxygen  -CT chest noted large right upper lobe lung mass with multiloculated right-sided likely malignant effusion  Assessment & Plan:   Acute hypoxic respiratory failure -Large right upper lobe lung mass presumed malignant -Large multiloculated pleural effusion, likely malignant Aspiration pneumonia -Pulmonary consulted for diagnostic and therapeutic thoracentesis, on further discussion with patient's family and patient she did not want any tests or procedures since this would not change management -This morning with worsening respiratory distress, appears to have aspirated, actively wheezing, tachypneic, hypoxic -Called and discussed with daughter, given advanced age, likely metastatic lung cancer, worsening respiratory failure, strongly recommended comfort focused care, family agreeable to comfort care -Anticipate hospital demise  Atrial fibrillation with RVR -Now comfort care  Acute kidney injury  Creatinine of 1.18 with unknown baseline -She is fluid overloaded and has known history of congestive heart failure  History of colon cancer  Type 2 diabetes BG of 151 on admit. Monitor.  Hypertension Hold Lasix due to AKI  DVT prophylaxis:SCDs Code Status: DNR Family Communication: No family at  bedside, called and updated daughter Peter [place] disposition Plan: anticipate hospital demise  Consultants:   Pulmonary   Procedures:   Antimicrobials:    Subjective: -Overnight with worsening respiratory distress, complains of being short of breath, tired and weak  Objective: Vitals:   01/17/20 0346 01/17/20 0403 01/17/20 0812 01/17/20 1154  BP: 93/73 116/78 112/73 111/74  Pulse: 96 99 95 93  Resp: (!) 24  18 18   Temp: 99.3 F (37.4 C) 99 F (37.2 C) 98.1 F (36.7 C)   TempSrc: Oral Oral Oral   SpO2: 97%  98% 99%  Weight:  86.6 kg    Height:        Intake/Output Summary (Last 24 hours) at 01/17/2020 1436 Last data filed at 01/17/2020 1300 Gross per 24 hour  Intake 600 ml  Output 0 ml  Net 600 ml   Filed Weights   01/21/2020 1242 01/17/20 0403  Weight: 91.2 kg 86.6 kg    Examination:  Gen: Elderly female sitting up in bed, uncomfortable appearing, confused with respiratory distress HEENT: No JVD Lungs: Diffuse scattered rhonchi and conducted upper airway sounds, diminished breath sounds in the right CVS: S1-S2, tachycardic Abd: soft, Non tender, non distended, BS present Extremities: Trace edema Skin: no new rashes Psychiatry: Poor insight and judgment     Data Reviewed:   CBC: Recent Labs  Lab 12/31/2019 1241 01/16/20 0404  WBC 10.7* 9.8  HGB 12.9 11.8*  HCT 43.3 40.6  MCV 80.8 82.0  PLT 359 628   Basic Metabolic Panel: Recent Labs  Lab 01/21/2020 1241 01/16/20 0404  NA 140 142  K 4.0 4.2  CL 104 102  CO2 24 26  GLUCOSE 151* 121*  BUN 19 17  CREATININE 1.18* 1.03*  CALCIUM 10.3 10.3  GFR: Estimated Creatinine Clearance: 31.9 mL/min (A) (by C-G formula based on SCr of 1.03 mg/dL (H)). Liver Function Tests: Recent Labs  Lab 01/14/2020 1656  AST 14*  ALT 15  ALKPHOS 62  BILITOT 0.9  PROT 6.6  ALBUMIN 2.8*   Recent Labs  Lab 01/17/2020 1656  LIPASE 22   No results for input(s): AMMONIA in the last 168 hours. Coagulation Profile: No  results for input(s): INR, PROTIME in the last 168 hours. Cardiac Enzymes: No results for input(s): CKTOTAL, CKMB, CKMBINDEX, TROPONINI in the last 168 hours. BNP (last 3 results) No results for input(s): PROBNP in the last 8760 hours. HbA1C: No results for input(s): HGBA1C in the last 72 hours. CBG: No results for input(s): GLUCAP in the last 168 hours. Lipid Profile: No results for input(s): CHOL, HDL, LDLCALC, TRIG, CHOLHDL, LDLDIRECT in the last 72 hours. Thyroid Function Tests: No results for input(s): TSH, T4TOTAL, FREET4, T3FREE, THYROIDAB in the last 72 hours. Anemia Panel: No results for input(s): VITAMINB12, FOLATE, FERRITIN, TIBC, IRON, RETICCTPCT in the last 72 hours. Urine analysis:    Component Value Date/Time   COLORURINE YELLOW 01/17/2020 2303   APPEARANCEUR HAZY (A) 01/02/2020 2303   LABSPEC 1.042 (H) 01/22/2020 2303   PHURINE 5.0 01/02/2020 2303   GLUCOSEU NEGATIVE 01/20/2020 2303   HGBUR MODERATE (A) 01/26/2020 2303   BILIRUBINUR NEGATIVE 01/04/2020 2303   [place] 01/17/2020 2303   PROTEINUR 30 (A) 01/11/2020 2303   NITRITE NEGATIVE 01/14/2020 2303   LEUKOCYTESUR MODERATE (A) 01/22/2020 2303   Sepsis Labs: @LABRCNTIP (procalcitonin:4,lacticidven:4)  ) Recent Results (from the past 240 hour(s))  Blood culture (routine x 2)     Status: None (Preliminary result)   Collection Time: 01/12/2020  4:50 PM   Specimen: BLOOD  Result Value Ref Range Status   Specimen Description BLOOD SITE NOT SPECIFIED  Final   Special Requests   Final    BOTTLES DRAWN AEROBIC AND ANAEROBIC Blood Culture results may not be optimal due to an inadequate volume of blood received in culture bottles   Culture   Final    NO GROWTH 2 DAYS Performed at [facility], [place] [address], [place], [place] [address]    Report Status PENDING  Incomplete  Respiratory Panel by RT PCR (Flu A&B, Covid) - Nasopharyngeal Swab     Status: None   Collection Time: 01/06/2020  7:07 PM    Specimen: Nasopharyngeal Swab  Result Value Ref Range Status   SARS Coronavirus 2 by RT PCR NEGATIVE NEGATIVE Final    Comment: (NOTE) SARS-CoV-2 target nucleic acids are NOT DETECTED. The SARS-CoV-2 RNA is generally detectable in upper respiratoy specimens during the acute phase of infection. The lowest concentration of SARS-CoV-2 viral copies this assay can detect is 131 copies/mL. A negative result does not preclude SARS-Cov-2 infection and should not be used as the sole basis for treatment or other patient management decisions. A negative result may occur with  improper specimen collection/handling, submission of specimen other than nasopharyngeal swab, presence of viral mutation(s) within the areas targeted by this assay, and inadequate number of viral copies (<131 copies/mL). A negative result must be combined with clinical observations, patient history, and epidemiological information. The expected result is Negative. Fact Sheet for Patients:  PinkCheek.be Fact Sheet for Healthcare Providers:  GravelBags.it This test is not yet ap proved or cleared by the [place] FDA and  has been authorized for detection and/or diagnosis of SARS-CoV-2 by FDA under an Emergency Use Authorization (  EUA). This EUA will remain  in effect (meaning this test can be used) for the duration of the COVID-19 declaration under Section 564(b)(1) of the Act, 21 U.S.C. section 360bbb-3(b)(1), unless the authorization is terminated or revoked sooner.    Influenza A by PCR NEGATIVE NEGATIVE Final   Influenza B by PCR NEGATIVE NEGATIVE Final    Comment: (NOTE) The Xpert Xpress SARS-CoV-2/FLU/RSV assay is intended as an aid in  the diagnosis of influenza from Nasopharyngeal swab specimens and  should not be used as a sole basis for treatment. Nasal washings and  aspirates are unacceptable for Xpert Xpress SARS-CoV-2/FLU/RSV  testing. Fact Sheet  for Patients: PinkCheek.be Fact Sheet for Healthcare Providers: GravelBags.it This test is not yet approved or cleared by the [place] FDA and  has been authorized for detection and/or diagnosis of SARS-CoV-2 by  FDA under an Emergency Use Authorization (EUA). This EUA will remain  in effect (meaning this test can be used) for the duration of the  Covid-19 declaration under Section 564(b)(1) of the Act, 21  U.S.C. section 360bbb-3(b)(1), unless the authorization is  terminated or revoked. Performed at [facility], [place] [address] [place], [place] [address]   Blood culture (routine x 2)     Status: None (Preliminary result)   Collection Time: 01/16/20  4:04 AM   Specimen: BLOOD  Result Value Ref Range Status   Specimen Description BLOOD RIGHT ARM  Final   Special Requests   Final    BOTTLES DRAWN AEROBIC AND ANAEROBIC Blood Culture adequate volume   Culture   Final    NO GROWTH 1 DAY Performed at [facility], [place] [address], [place], [place] [address]    Report Status PENDING  Incomplete         Radiology Studies: CT Angio Chest PE W and/or Wo Contrast  Result Date: 01/10/2020 CLINICAL DATA:  Portal vein thrombosis. Concern for mesenteric ischemia. Shortness of breath. EXAM: CT ANGIOGRAPHY CHEST CT ABDOMEN AND PELVIS WITH CONTRAST TECHNIQUE: Multidetector CT imaging of the chest was performed using the standard protocol during bolus administration of intravenous contrast. Multiplanar CT image reconstructions and MIPs were obtained to evaluate the vascular anatomy. Multidetector CT imaging of the abdomen and pelvis was performed using the standard protocol during bolus administration of intravenous contrast. CONTRAST:  71mL OMNIPAQUE IOHEXOL 350 MG/ML SOLN COMPARISON:  None. FINDINGS: CTA CHEST FINDINGS Cardiovascular: Contrast injection is sufficient to demonstrate satisfactory opacification of the pulmonary  arteries to the segmental level.There is no acute pulmonary embolism. There is complete occlusion of the right upper lobe pulmonary artery. This is secondary to mass effect from a large right upper lobe lung mass. There is some incomplete filling of several segmental branches of the right lower lobe and right middle lobe which are felt to be artifactual and related to contrast timing as opposed to acute pulmonary emboli. The main pulmonary artery is the pulmonary arteries are dilated. There is no CT evidence of acute right heart strain. There are atherosclerotic changes of the thoracic aorta. There is no evidence for a thoracic aortic aneurysm. Heart size is enlarged. There is no significant pericardial effusion. Coronary artery calcifications are noted. There is reflux of contrast in the IVC consistent with right-sided cardiac dysfunction. Mediastinum/Nodes: --there are few mildly prominent mediastinal and right hilar lymph nodes. --No axillary lymphadenopathy. --No supraclavicular lymphadenopathy. --Normal thyroid gland. --The esophagus is unremarkable Lungs/Pleura: There is a large right upper lobe lung mass measuring 7.3 x 6.7  cm. This mass abuts the right hilum superiorly and occludes the right upper lobe pulmonary artery. There is a small airspace opacity in the superior left upper lobe measuring approximately 8 mm (axial series 4, image 26). There is a 1 cm pulmonary opacity in the lingula (axial series 4, image 66). There is a complex multiloculated right-sided, likely malignant pleural effusion. There is a 7 mm pulmonary nodule in the superior aspect of the right lower lobe (axial series 4, image 44). There is near complete collapse of the right upper lobe and right middle lobes. There is partial collapse of the right lower lobe. Musculoskeletal: No chest wall abnormality. No acute or significant osseous findings. Review of the MIP images confirms the above findings. CT ABDOMEN and PELVIS FINDINGS  Hepatobiliary: There is no significant abnormality involving the liver. There is a wedge-shaped hyperattenuating area in hepatic segment 5 favored to represent sequela of perfusion differences. Normal gallbladder.There is no biliary ductal dilation. There is no definite evidence for portal vein thrombosis. Appearance of the portal vein at the level of the portal venous confluence is felt to be secondary to mixing artifact. Pancreas: Normal contours without ductal dilatation. No peripancreatic fluid collection. Spleen: No splenic laceration or hematoma. Adrenals/Urinary Tract: --Adrenal glands: No adrenal hemorrhage. --Right kidney/ureter: There is a punctate nonobstructing stone in the interpolar region of the right kidney. There is no right-sided hydronephrosis. --Left kidney/ureter: The lower pole the left kidney is atrophic, likely secondary to multiple obstructing stones measuring up to approximately 1 cm. This process is almost certainly longstanding. There is a large cyst arising from the upper pole. --Urinary bladder: Unremarkable. Stomach/Bowel: --Stomach/Duodenum: No hiatal hernia or other gastric abnormality. Normal duodenal course and caliber. --Small bowel: No dilatation or inflammation. --Colon: Pelvic floor laxity is noted. There is scattered colonic diverticula without CT evidence for diverticulitis. The patient appears to be status post partial colectomy. --Appendix: Likely surgically absent. Vascular/Lymphatic: Atherosclerotic calcification is present within the non-aneurysmal abdominal aorta, without hemodynamically significant stenosis. There is moderate stenosis at the origin of both the SMA and celiac axis. --No retroperitoneal lymphadenopathy. --No mesenteric lymphadenopathy. --No pelvic or inguinal lymphadenopathy. Reproductive: Unremarkable Other: No ascites or free air. Multiple fat containing ventral wall hernias are noted. Musculoskeletal. There is an indeterminate mix lytic and sclerotic  lesion involving the right posterior iliac crest measuring approximately 2.3 by 2.2 cm (axial series 5, image 125). Review of the MIP images confirms the above findings. IMPRESSION: 1. No acute pulmonary embolism. 2. Large right upper lobe lung mass as detailed above highly concerning for bronchogenic carcinoma. This mass causes complete occlusion of portions of the right upper lobe pulmonary artery. 3. Large complex, likely malignant, right-sided pleural effusion. 4. Bilateral pulmonary opacities as detailed above. While these may represent a post infectious or inflammatory etiology, metastatic disease is not excluded. 5. Indeterminate lesion involving the posterior right iliac crest. Attention on follow-up examinations is recommended. Metastatic disease is not excluded. 6. Cardiomegaly with reflux of contrast into the IVC consistent with underlying cardiac dysfunction. The pulmonary arteries are dilated consistent with underlying pulmonary artery hypertension. 7. Bilateral nephrolithiasis as detailed above. 8. No definite evidence for portal vein thrombosis or mesenteric ischemia. However, there is significant narrowing at the origin of both the celiac axis and SMA. The IMA remains patent. Evaluation of the portal vein was somewhat limited by contrast bolus timing. Aortic Atherosclerosis (ICD10-I70.0). Electronically Signed   By: Constance Holster M.D.   On: 01/08/2020 19:36   CT Angio  Abd/Pel W and/or Wo Contrast  Result Date: 01/07/2020 CLINICAL DATA:  Portal vein thrombosis. Concern for mesenteric ischemia. Shortness of breath. EXAM: CT ANGIOGRAPHY CHEST CT ABDOMEN AND PELVIS WITH CONTRAST TECHNIQUE: Multidetector CT imaging of the chest was performed using the standard protocol during bolus administration of intravenous contrast. Multiplanar CT image reconstructions and MIPs were obtained to evaluate the vascular anatomy. Multidetector CT imaging of the abdomen and pelvis was performed using the standard  protocol during bolus administration of intravenous contrast. CONTRAST:  69mL OMNIPAQUE IOHEXOL 350 MG/ML SOLN COMPARISON:  None. FINDINGS: CTA CHEST FINDINGS Cardiovascular: Contrast injection is sufficient to demonstrate satisfactory opacification of the pulmonary arteries to the segmental level.There is no acute pulmonary embolism. There is complete occlusion of the right upper lobe pulmonary artery. This is secondary to mass effect from a large right upper lobe lung mass. There is some incomplete filling of several segmental branches of the right lower lobe and right middle lobe which are felt to be artifactual and related to contrast timing as opposed to acute pulmonary emboli. The main pulmonary artery is the pulmonary arteries are dilated. There is no CT evidence of acute right heart strain. There are atherosclerotic changes of the thoracic aorta. There is no evidence for a thoracic aortic aneurysm. Heart size is enlarged. There is no significant pericardial effusion. Coronary artery calcifications are noted. There is reflux of contrast in the IVC consistent with right-sided cardiac dysfunction. Mediastinum/Nodes: --there are few mildly prominent mediastinal and right hilar lymph nodes. --No axillary lymphadenopathy. --No supraclavicular lymphadenopathy. --Normal thyroid gland. --The esophagus is unremarkable Lungs/Pleura: There is a large right upper lobe lung mass measuring 7.3 x 6.7 cm. This mass abuts the right hilum superiorly and occludes the right upper lobe pulmonary artery. There is a small airspace opacity in the superior left upper lobe measuring approximately 8 mm (axial series 4, image 26). There is a 1 cm pulmonary opacity in the lingula (axial series 4, image 66). There is a complex multiloculated right-sided, likely malignant pleural effusion. There is a 7 mm pulmonary nodule in the superior aspect of the right lower lobe (axial series 4, image 44). There is near complete collapse of the right  upper lobe and right middle lobes. There is partial collapse of the right lower lobe. Musculoskeletal: No chest wall abnormality. No acute or significant osseous findings. Review of the MIP images confirms the above findings. CT ABDOMEN and PELVIS FINDINGS Hepatobiliary: There is no significant abnormality involving the liver. There is a wedge-shaped hyperattenuating area in hepatic segment 5 favored to represent sequela of perfusion differences. Normal gallbladder.There is no biliary ductal dilation. There is no definite evidence for portal vein thrombosis. Appearance of the portal vein at the level of the portal venous confluence is felt to be secondary to mixing artifact. Pancreas: Normal contours without ductal dilatation. No peripancreatic fluid collection. Spleen: No splenic laceration or hematoma. Adrenals/Urinary Tract: --Adrenal glands: No adrenal hemorrhage. --Right kidney/ureter: There is a punctate nonobstructing stone in the interpolar region of the right kidney. There is no right-sided hydronephrosis. --Left kidney/ureter: The lower pole the left kidney is atrophic, likely secondary to multiple obstructing stones measuring up to approximately 1 cm. This process is almost certainly longstanding. There is a large cyst arising from the upper pole. --Urinary bladder: Unremarkable. Stomach/Bowel: --Stomach/Duodenum: No hiatal hernia or other gastric abnormality. Normal duodenal course and caliber. --Small bowel: No dilatation or inflammation. --Colon: Pelvic floor laxity is noted. There is scattered colonic diverticula without CT evidence  for diverticulitis. The patient appears to be status post partial colectomy. --Appendix: Likely surgically absent. Vascular/Lymphatic: Atherosclerotic calcification is present within the non-aneurysmal abdominal aorta, without hemodynamically significant stenosis. There is moderate stenosis at the origin of both the SMA and celiac axis. --No retroperitoneal  lymphadenopathy. --No mesenteric lymphadenopathy. --No pelvic or inguinal lymphadenopathy. Reproductive: Unremarkable Other: No ascites or free air. Multiple fat containing ventral wall hernias are noted. Musculoskeletal. There is an indeterminate mix lytic and sclerotic lesion involving the right posterior iliac crest measuring approximately 2.3 by 2.2 cm (axial series 5, image 125). Review of the MIP images confirms the above findings. IMPRESSION: 1. No acute pulmonary embolism. 2. Large right upper lobe lung mass as detailed above highly concerning for bronchogenic carcinoma. This mass causes complete occlusion of portions of the right upper lobe pulmonary artery. 3. Large complex, likely malignant, right-sided pleural effusion. 4. Bilateral pulmonary opacities as detailed above. While these may represent a post infectious or inflammatory etiology, metastatic disease is not excluded. 5. Indeterminate lesion involving the posterior right iliac crest. Attention on follow-up examinations is recommended. Metastatic disease is not excluded. 6. Cardiomegaly with reflux of contrast into the IVC consistent with underlying cardiac dysfunction. The pulmonary arteries are dilated consistent with underlying pulmonary artery hypertension. 7. Bilateral nephrolithiasis as detailed above. 8. No definite evidence for portal vein thrombosis or mesenteric ischemia. However, there is significant narrowing at the origin of both the celiac axis and SMA. The IMA remains patent. Evaluation of the portal vein was somewhat limited by contrast bolus timing. Aortic Atherosclerosis (ICD10-I70.0). Electronically Signed   By: Constance Holster M.D.   On: 01/01/2020 19:36        Scheduled Meds: . glycopyrrolate  0.2 mg Intravenous BID  . LORazepam  0.25 mg Intravenous BID  . metoprolol tartrate  50 mg Oral QHS  . sodium chloride flush  3 mL Intravenous Once   Continuous Infusions: . morphine 1 mg/hr (01/17/20 1046)     LOS: 2  days    Time spent: 35min  Domenic Polite, MD [facility]  01/17/2020, 2:36 PM

## 2020-01-17 NOTE — Significant Event (Signed)
Rapid Response Event Note  Overview: Time Called: 5329 Arrival Time: 0940 Event Type: Respiratory Called by primary RN for pt having increased supplemental oxygen requirement. Pt tachycardic 120-135 bpm.  Per primary RN, pt likely aspirated this morning. Subsequently pt required 15L NRB. Pt given morphine. Dr. Broadus John calling family to have goals of care discussion.  Initial Focused Assessment: Pt lying in bed. HR 130 bpm and SpO2 98% on 15L NRB. Coarse crackles and rhonchi heard in bilateral upper and lower lobes, labored breathing. Pt opens eyes and tracks RN in room. Pt attempts to pull off NRB mask.   Interventions: Requested update on GOC from MD. Dr. Broadus John spoke with pt's family and decision was made to transition to comfort care.  Plan of Care (if not transferred): Order for comfort measures.   Event Summary: Name of Physician Notified: Dr. Broadus John at [phone]    at    Outcome: Other (Comment)  Event End Time: [place]

## 2020-01-17 NOTE — Progress Notes (Signed)
Patient coughing with breakfast tray,heart rate up to 160's has already had her cardizem po and prn morphine for c/o shortness of breath  desats down to 77% o2 2 increased to 6 liters per minute RT called reported to alert MD writer has she refused her thoracentesis and is being transitioned to pallative care. She was ordered an additional dose of morphine for shortness of breath Dr.Jospeph to call daughter discuss with her treatment options. Care ongoing no further changes.

## 2020-01-17 NOTE — Progress Notes (Signed)
Responded to Spiritual Care Consult.  Spoke with nurse who thought Mrs. Aken may not know I was in the room but bedside prayer would be welcomed by her family.  I offered prayer at Ms. Marzan's bedside.  Upon leaving the unit I chanced to meet her daughter and two other family members.  I escorted them to Mrs. Bougie's room and left them with the invitation to have the Chaplain called if they needed additional support.  De Burrs Chaplain Resident

## 2020-01-17 NOTE — Progress Notes (Signed)
Rapid response nurse to come by and see patient per writer request talked with [facility].

## 2020-01-17 NOTE — Progress Notes (Signed)
Patient is comfort care after pallative talked with family,she has been started on morphine drip I am able maintain her o2 sats with n/c now she is at 6 lpm with o2 sat 97% at present. [place] daughter called to check her her Grandma this am as well as daughter they are aware of her status.

## 2020-01-17 NOTE — Progress Notes (Signed)
Additional dose morphine given as ordered o2 increased again,MD aware to call talk with family regarding patjent condition as she is a DNR.

## 2020-01-17 NOTE — Progress Notes (Signed)
Palliative follow up.  Chart reviewed.  Communicated with Dr. Broadus John, [facility] attending, patient's daughters, PMT RN Larina Bras and bedside RN.  Unfortunately Ms. Sanabia aspirated this am and continues to do so.  She has worsening acute respiratory failure in the setting of colon cancer and now metastatic cancer thought by imaging to be bronchogenic lung cancer.  She has mets in her bones, and bilateral lungs.  Her right upper lobe pulmonary artery is encased in tumor and is occluded.  She has loculated pleural effusion not amenable to thoracentesis.  After aspiration this am she is on 15L with NRB mask, with pulse rate of 130.    Dr. Broadus John discussed transitioning to a goal of comfort rather than stabilization and a low dose morphine gtt was initiated.  I spoke with her daughter Peter Congo to answer any questions.  I asked if I could be direct and once given permission, I explained that Mrs. Yankowski is dying.  Peter Congo and her sisters and unable to come to the hospital at this time due to the ice/rain.  If she changes her mind I asked her to call before coming so that we could give security the names of the 4 family members who would be allowed to visit.  Peter Congo understands.  I assured Peter Congo we would do our best to make sure that her mother is comfortable and well supported.  She has my contact information for any questions or concerns.  Assessment:  89 yof with advanced metastatic cancer causing hypoxia and tachycardia who has now aspirated and is nearing EOL.  Recommendations:  Agree with full comfort measures.  EOL support orders placed.  PMT will continue to follow for support and symptom management.  Disposition:  Likely hospital death.  Florentina Jenny, PA-C Palliative Medicine Office:  [phone]  Total time 35 min.  Greater than 50%  of this time was spent counseling and coordinating care related to the above assessment and plan.

## 2020-01-17 NOTE — Progress Notes (Signed)
Palliative Medicine RN Note: Rec'd call from pt's daughter Peter Congo. She and her two sisters will be coming to visit. In addition to her Peter Congo and Deneise Lever (listed as contacts), Mrs Reddix daughter Benedetto Coons will also be coming. I notified the front desk and the nursing station.  Marjie Skiff Ulysees Robarts, RN, BSN, [facility] Palliative Medicine Team 01/17/2020 11:25 AM Office [phone]

## 2020-01-17 NOTE — Progress Notes (Signed)
pts vs 93/78 rr 24 temp 99.8 mews yellow  Pt states she feels the same  MD notified  Vitals recheck shows Blood pressure 116/78, pulse 99, temperature 99 F (37.2 C), temperature source Oral, resp. rate (!) 24, height 5' (1.524 m), weight 91.2 kg, SpO2 97 %.  Will continue to monitor

## 2020-01-17 NOTE — Progress Notes (Addendum)
Palliative Medicine RN Note: Symptom check. Pt was having some respiratory distress when I arrived earlier today. Her nurse Barbie administered her scheduled Ativan and prn morphine, and pt showed no signes of respiratory distress within 10 minutes of administration.   Discussed medication plan with Barbie; she will call with with questions/concerns. I reviewed orders to make sure needed nursing orders were in place.  I ensured staff has our number in case they need [place]; PMT will follow up tomorrow unless other needs arise.  Marjie Skiff Oanh Devivo, RN, BSN, [facility] Palliative Medicine Team 01/17/2020 1:06 PM Office [phone]

## 2020-01-17 NOTE — Progress Notes (Signed)
Non rebreather mask placed on patient with o2 sats returning to 91%,heart rate 130's to 140's.

## 2020-01-17 NOTE — Progress Notes (Signed)
Responded to spiritual Care Consult to support patient.  Upon arrival patient nurse indicated that patient was sleeping and therefore I did not disturb. Prayed for patient.  Chaplain available as needed.  Jaclynn Major, [place], [facility], Pager [phone]

## 2020-01-18 MED ORDER — BISACODYL 10 MG RE SUPP: 10.0000 mg | Freq: Every day | RECTAL | Status: DC | PRN

## 2020-01-18 MED ORDER — ACETAMINOPHEN 650 MG RE SUPP: 325.0000 mg | RECTAL | Status: DC | PRN

## 2020-01-20 LAB — CULTURE, BLOOD (ROUTINE X 2): Culture: NO GROWTH

## 2020-01-21 DIAGNOSIS — J9691 Respiratory failure, unspecified with hypoxia: Secondary | ICD-10-CM | POA: Diagnosis present

## 2020-01-21 DIAGNOSIS — C3490 Malignant neoplasm of unspecified part of unspecified bronchus or lung: Secondary | ICD-10-CM | POA: Diagnosis present

## 2020-01-21 DIAGNOSIS — J690 Pneumonitis due to inhalation of food and vomit: Secondary | ICD-10-CM | POA: Diagnosis present

## 2020-01-21 DIAGNOSIS — J910 Malignant pleural effusion: Secondary | ICD-10-CM | POA: Diagnosis present

## 2020-01-21 LAB — CULTURE, BLOOD (ROUTINE X 2)
Culture: NO GROWTH
Special Requests: ADEQUATE

## 2020-01-28 NOTE — Progress Notes (Signed)
Daughter has arrived updated on status of patient. Arrived to room [place] daughter face timing with patient and patient's daughter was updating her on status of patient. Patient's respirations are shallow 12-14 at present conts with Morphine drip at 2 mg/hr. Heart rate is mainting at 74 at present. No further changes noted.

## 2020-01-28 NOTE — Progress Notes (Signed)
Nutrition Brief Note  Chart reviewed. Pt now transitioning to comfort care.  No further nutrition interventions warranted at this time.  Please re-consult as needed.   Zeena Starkel W, RD, LDN, CDCES Registered Dietitian II Certified Diabetes Care and Education Specialist Please refer to [facility] for RD and/or RD on-call/weekend/after hours pager  

## 2020-01-28 NOTE — Progress Notes (Signed)
Paged MD in [place] alert again patient expiration. Await response.

## 2020-01-28 NOTE — Progress Notes (Signed)
Patient placement notified patients expiration will send body down to [facility] for pickup via [facility]. PIV removed intact.

## 2020-01-28 NOTE — Progress Notes (Signed)
Wasted 36ml's of Morphine Drip down stericycle with Eun,Rn.Morphine drip has been stopped due to patient expiration. Family has left this time.

## 2020-01-28 NOTE — Progress Notes (Signed)
Chaplain prayed over Ms. Tammy Perez at request of family.    Chaplain is available to follow-up as needed.

## 2020-01-28 NOTE — Progress Notes (Signed)
[facility] informed of patient's expiration at 1305 talked with Ronna Polio she gave referral number [phone] released the patient from donation stated due to age not suitalbe for tissue,eye or,organ donation.

## 2020-01-28 NOTE — Progress Notes (Signed)
Patient has no BP,NO audible heart rate, pupils are fixed and dilated,she has no sponetaneous respirations she has expired. Family at bedside gave funeral home information as [place] home on [address]. The family took her belongings,her false teeth stated she had no glasses. Daughter Beacher May is point of contact for the funeral home left phone number as follows: home [phone] # [phone]. Writer beeped MD to inform of above.

## 2020-01-28 NOTE — Progress Notes (Signed)
Patient conts with comfort care her bp is down at present with systolic in the 95'L,OPR [place] score is 4,MD is aware patient is pallative care and no further measures will be taken to correct the bp her daughter is at bedside aware.

## 2020-01-28 NOTE — Progress Notes (Signed)
Palliative Medicine Inpatient Follow Up Note   HPI: H&P --> Tammy Bakeris a 86 y.o.femalewith medical history significant forcolon cancer, TIA, atrial fibrillation on Eliquis, hypertension, and type 2 diabetes who presented with concerns of atrial fibrillation with RVR, hypoxia and abdominal pain from PCP office.  Patient recently moved here in January from [place] to live with her daughter as she was no longer able to care for herself. Patient reports that since March 2020 she has been having issues with occasionalnausea and this became a daily occurrence by July. Denies ever vomiting. In September 2020, she was hospitalized in [place] with what daughter reports to be a vulvar cyst requiring removal and also noted to have "blockage"of her abdominal aorta. Since then, patient has been getting progressively weaker and has had decreased p.o. intake. Reportedly she lost about 20 pounds since she got out of rehab. Daughter has also seen that she has been short of breath when she lays flat but patient denies feeling shortness of breath. She presented to her PCP for follow up today and was sent over for evaluation due to hypoxic and tachycardia.  On 2/18 patient had an acute decline and was transitioned to comfort measures  Today's Discussion (2020/01/25): Chart reviewed. Evaluated miss Tammy Perez at bedside. She was noted to be on a morphine gtt and appears quite comfortable.    Vital Signs Vitals:   01/17/20 1154 01/17/20 2114  BP: 111/74 95/65  Pulse: 93 77  Resp: 18 12  Temp:  99.1 F (37.3 C)  SpO2: 99% 96%    Intake/Output Summary (Last 24 hours) at 2020-01-25 0745 Last data filed at 01/25/20 0000 Gross per 24 hour  Intake 128.23 ml  Output 300 ml  Net -171.77 ml   Last Weight  Most recent update: 01/17/2020  6:02 AM   Weight  86.6 kg (190 lb 14.7 oz)           Physical Exam Vitals and nursing note reviewed.  HENT:     Head: Normocephalic.   Mouth/Throat:     Mouth: Mucous membranes are dry    Pharynx: Oropharynx is clear.  Eyes:     Extraocular Movements: Extraocular movements intact.  Cardiovascular:     Rate and Rhythm: Tachycardia present. Rhythm irregular.  Abdominal:     General: Abdomen is protuberant. Bowel sounds are normal.     Palpations: Abdomen is soft.  Skin:    General: Skin is warm and dry.   Extremities: cool    Capillary Refill: Capillary refill takes > 2 seconds.  Neurological:     Mental Status: She is somnolent   Recommendations and Plan: Anticipate in hospital death. Projected time frame within hours to days.  Dyspnea: - Morphine gtt  - Morphine bolus 2mg  IV Q15M  - Morphine 5mg  PO Q2H PRN Pain: - Tylenol 325mg  PR Q4H PRN Fever: - Tylenol as above Agitation: - Haldol 0.5mg  PO/SL/IV Q4H PRN Anxiety:  - Lorazepam 0.25mg  IV BID - Lorazepam 0.5 IV Q4H PRN Nausea: - Zofran 4mg  PO/IV Q6H PRN  - Phenergan 12.5mg  IV Q4H PRN Secretions: - Glycopyrrolate 0.2mg  IV BID and PRN Dry Eyes: - Artificial Tears PRN Xerostomia: - Biotene 78ml PRN - BID oral care Urinary Retention: - Bladder Scan QShift, if > 3101ml Place foley for comfort Constipation: - Bisacodyl 10mg  PR PRN QDay Spiritual: - Chaplain consult  Time Spent: 15 Greater than 50% of the time was spent in counseling and coordination of care ______________________________________________________________________________________ Tammy Perez  Team Team Cell Phone: [phone] Please utilize secure chat with additional questions, if there is no response within 30 minutes please call the above phone number  Palliative Medicine Team providers are available by phone from 7am to 7pm daily and  can be reached through the team cell phone.  Should this patient require assistance outside of these hours, please call the patient's attending physician.

## 2020-01-28 NOTE — Plan of Care (Signed)

## 2020-01-28 NOTE — Discharge Summary (Signed)
Death Summary  [name] DZH:[id number] DOB: January 14, 1922 DOA: 01-25-20  PCP: Jonathon Jordan, MD  Admit date: 2020/01/25 Date of Death: January 31, 2020  Final Diagnoses:  Principal Problem:   Acute respiratory failure with hypoxia ([place]) Active Problems:   Lung mass   Pleural effusion   Atrial fibrillation with RVR ([place])   AKI (acute kidney injury) ([place])   Type 2 diabetes mellitus without complication ([place])   Essential hypertension   Palliative care by specialist   Goals of care, counseling/discussion   Acute respiratory distress   DNR (do not resuscitate)   Malignant neoplasm metastatic to right lung ([place])   Comfort measures only status   Palliative care encounter   Metastatic lung cancer (metastasis from lung to other site) [facility])   Malignant pleural effusion   Respiratory failure with hypoxia and hypercapnia ([place])   Aspiration pneumonia ([place])    History of present illness:  Tammy Bakeris a 86 y.o.femalewith medical history significant forcolon cancer, TIA, atrial fibrillation on Eliquis, hypertension, and type 2 diabetes who presented with concerns of atrial fibrillation with RVR, hypoxia and abdominal pain from PCP office. Patient recently moved here in January from [place] to live with her daughter as she was no longer able to care for herself.  patient has been getting progressively weaker and has had decreased p.o. intake.  -In the emergency room she was noted to be in rapid A. fib and mild hypoxia requiring 2 L of oxygen  -CT chest noted large right upper lobe lung mass with multiloculated right-sided likely malignant effusion   Hospital Course:   Acute hypoxic respiratory failure -Large right upper lobe lung mass presumed malignant -Large multiloculated pleural effusion, likely malignant Aspiration pneumonia -Pulmonary consulted for diagnostic and therapeutic thoracentesis, on further discussion with patient's family and patient she did not want any tests  or procedures since this would not change management given advanced age of 29 -2/18 morning with worsening respiratory distress, appears to have aspirated, actively wheezing, tachypneic, hypoxic -Called and discussed with daughter, given advanced age, likely metastatic lung cancer, worsening respiratory failure, discussed poor prognosis with family, they were  agreeable to comfort focused care -Was followed by palliative care team as well and transition to comfort care, and expired on 2/19 at 1305 PM  Atrial fibrillation with RVR -Now comfort care at good idea  Acute kidney injury   History of colon cancer  Type 2 diabetes  Hypertension  Code Status:DNR Family Communication: No family at bedside, called and updated daughter Peter Congo  Time: 44min  Signed:  [facility] Jan 31, 2020, 1:40 PM

## 2020-01-28 DEATH — deceased

## 2020-02-03 ENCOUNTER — Ambulatory Visit: Payer: Self-pay

## 2020-02-04 ENCOUNTER — Ambulatory Visit: Payer: Self-pay

## 2020-02-15 ENCOUNTER — Ambulatory Visit: Payer: Self-pay | Admitting: Interventional Cardiology

## 2021-10-17 IMAGING — CR DG CHEST 2V
2 series · 2 of 2 positions shown · non-contrast
Comparison: None.

CLINICAL DATA: Chest pain

EXAM:
CHEST - 2 VIEW

[chest ap]
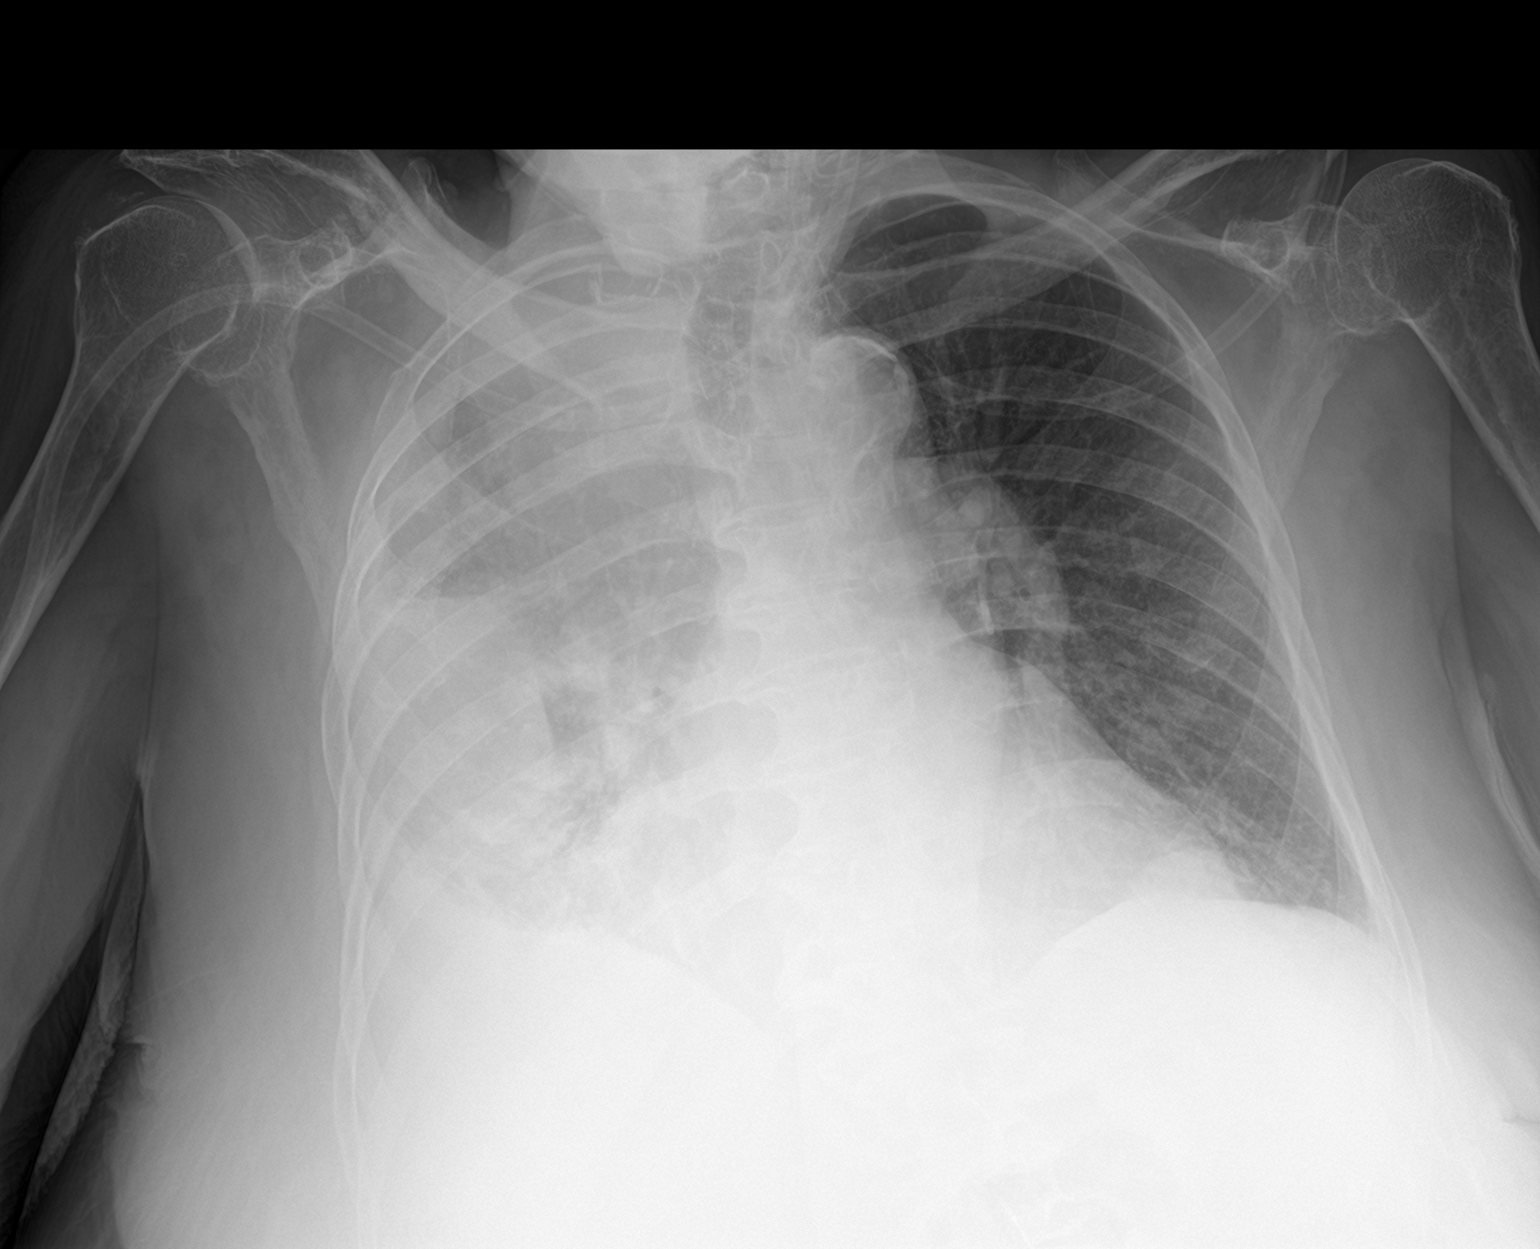

[chest lat]
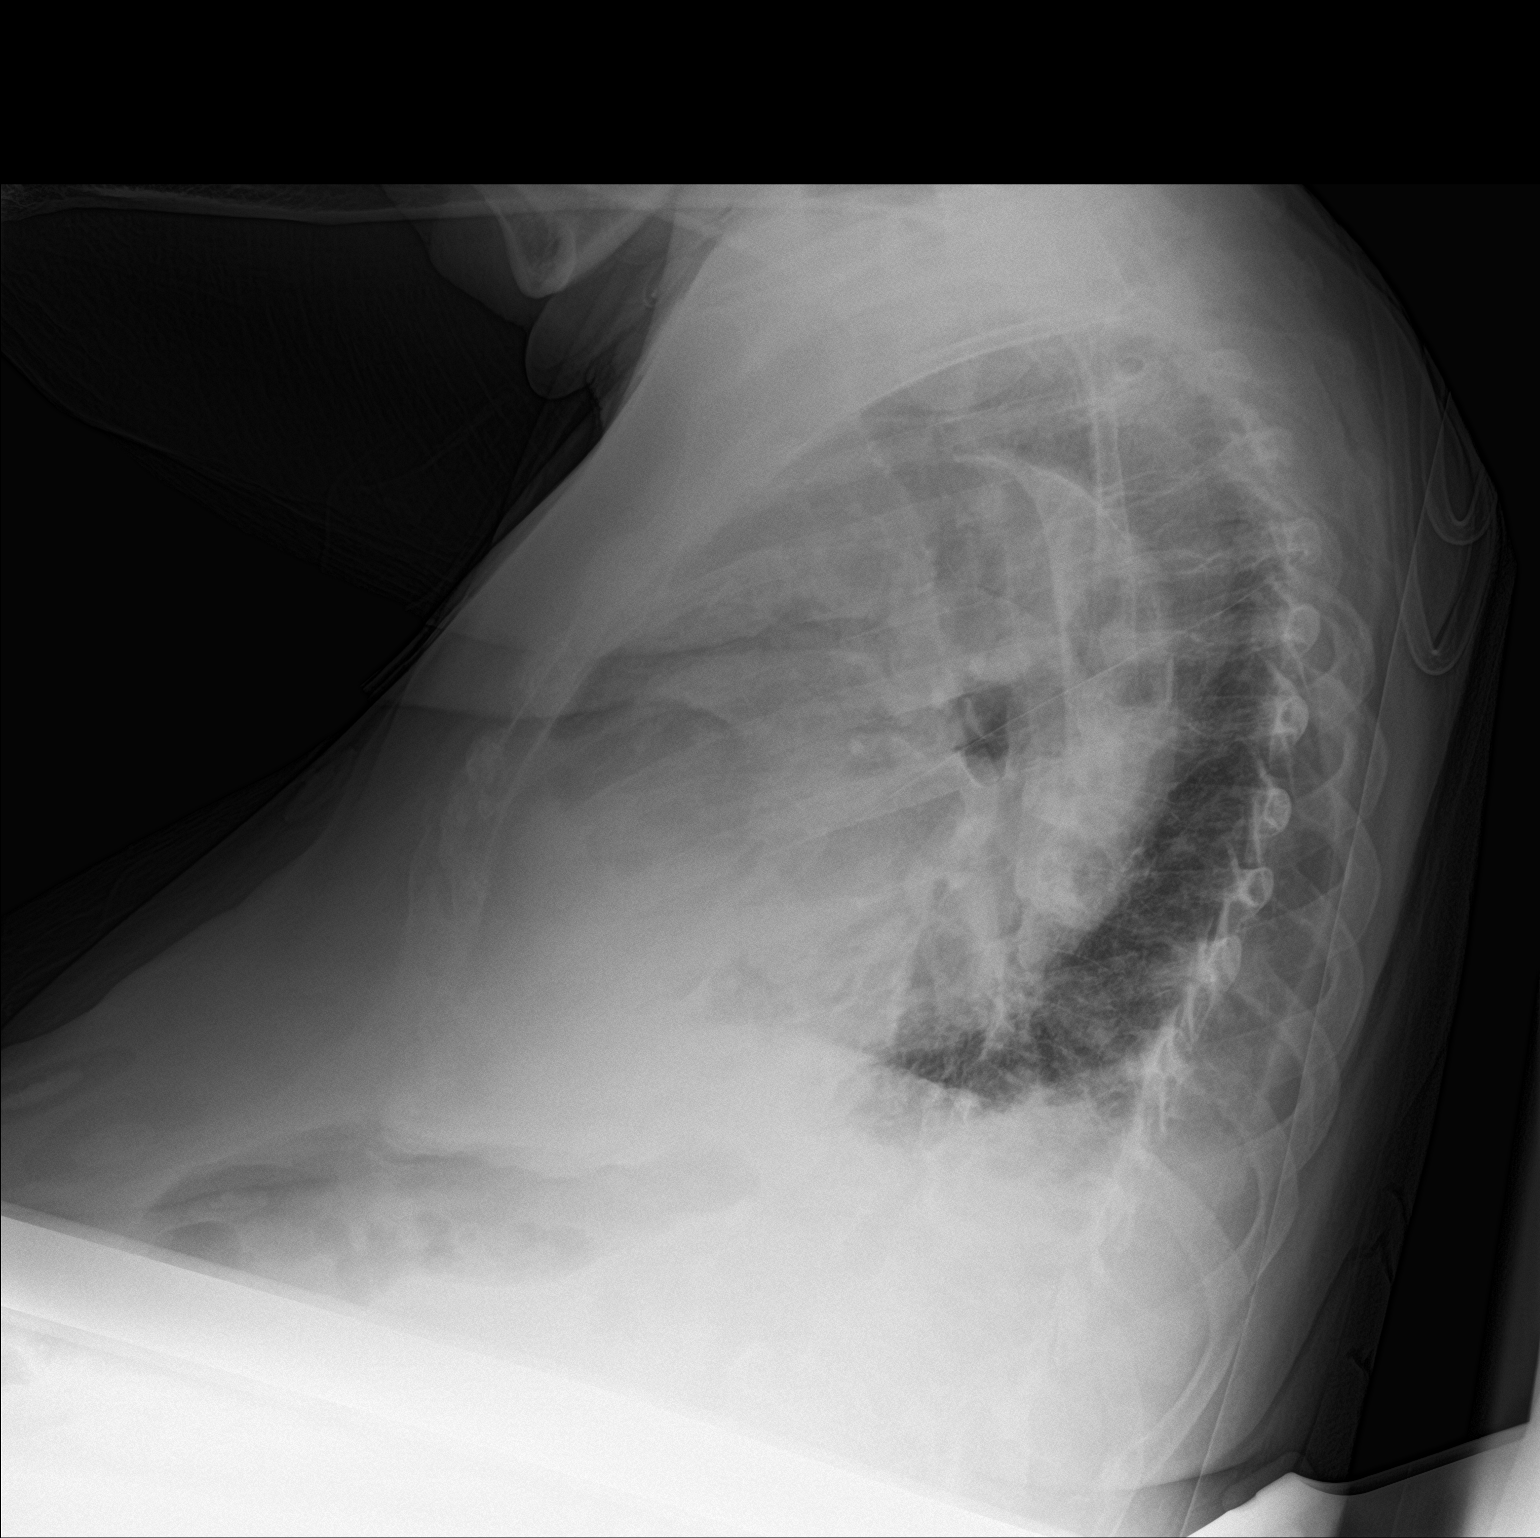

[2 of 2 positions shown; findings below may reference images not displayed]

FINDINGS: Partial opacification of the right hemithorax. Bilateral pleural
effusions. Left basilar atelectasis/consolidation. No pneumothorax.
Normal heart size. Calcified plaque along the aorta.
IMPRESSION: Partial opacification right hemithorax likely reflecting a
combination of effusion, atelectasis, and possible superimposed
asymmetric pulmonary edema. Left basilar atelectasis/consolidation.
